# Patient Record
Sex: Male | Born: 1995 | Race: White | Hispanic: No | Marital: Married | Smoking: Never smoker
Health system: Southern US, Community
[De-identification: ages and names within clinical notes are randomized; demographics above are authoritative.]

## PROBLEM LIST (undated history)

## (undated) DIAGNOSIS — J45909 Unspecified asthma, uncomplicated: Secondary | ICD-10-CM

## (undated) DIAGNOSIS — F329 Major depressive disorder, single episode, unspecified: Secondary | ICD-10-CM

## (undated) DIAGNOSIS — F419 Anxiety disorder, unspecified: Secondary | ICD-10-CM

## (undated) DIAGNOSIS — F1910 Other psychoactive substance abuse, uncomplicated: Secondary | ICD-10-CM

## (undated) DIAGNOSIS — F32A Depression, unspecified: Secondary | ICD-10-CM

## (undated) HISTORY — PX: ADENOIDECTOMY: SUR15

---
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
## 2013-12-21 ENCOUNTER — Encounter (HOSPITAL_COMMUNITY): Payer: Self-pay | Admitting: Emergency Medicine

## 2013-12-21 ENCOUNTER — Emergency Department (HOSPITAL_COMMUNITY)
Admission: EM | Admit: 2013-12-21 | Discharge: 2013-12-21 | Disposition: A | Discharge status: Home / Self Care | Payer: Medicaid Other | Attending: Emergency Medicine | Admitting: Emergency Medicine

## 2013-12-21 DIAGNOSIS — T401X1A Poisoning by heroin, accidental (unintentional), initial encounter: Secondary | ICD-10-CM | POA: Insufficient documentation

## 2013-12-21 DIAGNOSIS — J45909 Unspecified asthma, uncomplicated: Secondary | ICD-10-CM | POA: Insufficient documentation

## 2013-12-21 DIAGNOSIS — T401X4A Poisoning by heroin, undetermined, initial encounter: Secondary | ICD-10-CM | POA: Insufficient documentation

## 2013-12-21 DIAGNOSIS — Y9389 Activity, other specified: Secondary | ICD-10-CM | POA: Insufficient documentation

## 2013-12-21 DIAGNOSIS — F17200 Nicotine dependence, unspecified, uncomplicated: Secondary | ICD-10-CM | POA: Insufficient documentation

## 2013-12-21 DIAGNOSIS — Y929 Unspecified place or not applicable: Secondary | ICD-10-CM | POA: Insufficient documentation

## 2013-12-21 HISTORY — DX: Unspecified asthma, uncomplicated: J45.909

## 2013-12-21 NOTE — ED Notes (Signed)
Patient was unconscious and apnic on ems arrival. 2 mg of Narcan IM.

## 2013-12-21 NOTE — ED Notes (Signed)
Bed: RESB Expected date:  Expected time:  Means of arrival:  Comments: Heroin overdose 

## 2013-12-21 NOTE — ED Provider Notes (Signed)
CSN: [id number]     Arrival date & time 12/21/13  1708 History   First MD Initiated Contact with Patient 12/21/13 1716     Chief Complaint  Patient presents with  . Drug Overdose   (Consider location/radiation/quality/duration/timing/severity/associated sxs/prior Treatment) Patient is a 17 y.o. male presenting with Overdose. The history is provided by the patient.  Drug Overdose   patient here after an unintentional overdose of heroin prior to arrival. He injected medication to his left antecubital fossa and denies that this was a suicide attempt. Was found by his grandparents unresponsive and EMS was called. He had pinpoint pupils he was given Narcan 2 mg IM and he quickly returned to normal. He denies being short of breath or having any cough. Denies any alcohol use today. States he was only trying to become intoxicated. He feels back to his baseline at this time  Past Medical History  Diagnosis Date  . Asthma    History reviewed. No pertinent past surgical history. No family history on file. History  Substance Use Topics  . Smoking status: Current Every Day Smoker  . Smokeless tobacco: Not on file  . Alcohol Use: Yes    Review of Systems  All other systems reviewed and are negative.    Allergies  Review of patient's allergies indicates no known allergies.  Home Medications   Current Outpatient Rx  Name  Route  Sig  Dispense  Refill  . HYDROcodone-acetaminophen (NORCO/VICODIN) 5-325 MG per tablet   Oral   Take 1 tablet by mouth every 4 (four) hours as needed for pain.   12 tablet   0    BP 133/77  Pulse 95  Temp(Src) 98.2 F (36.8 C) (Oral)  Resp 16  Wt 175 lb (79.379 kg)  SpO2 95% Physical Exam  Nursing note and vitals reviewed. Constitutional: He is oriented to person, place, and time. He appears well-developed and well-nourished.  Non-toxic appearance. No distress.  HENT:  Head: Normocephalic and atraumatic.  Eyes: Conjunctivae, EOM and lids are normal.  Pupils are equal, round, and reactive to light.  Neck: Normal range of motion. Neck supple. No tracheal deviation present. No mass present.  Cardiovascular: Normal rate, regular rhythm and normal heart sounds.  Exam reveals no gallop.   No murmur heard. Pulmonary/Chest: Effort normal and breath sounds normal. No stridor. No respiratory distress. He has no decreased breath sounds. He has no wheezes. He has no rhonchi. He has no rales.  Abdominal: Soft. Normal appearance and bowel sounds are normal. He exhibits no distension. There is no tenderness. There is no rebound and no CVA tenderness.  Musculoskeletal: Normal range of motion. He exhibits no edema and no tenderness.  Neurological: He is alert and oriented to person, place, and time. He has normal strength. No cranial nerve deficit or sensory deficit. GCS eye subscore is 4. GCS verbal subscore is 5. GCS motor subscore is 6.  Skin: Skin is warm and dry. No abrasion and no rash noted.  Psychiatric: He has a normal mood and affect. His speech is normal and behavior is normal. He expresses no suicidal plans and no homicidal plans.    ED Course  Procedures (including critical care time) Labs Review Labs Reviewed - No data to display Imaging Review No results found.  EKG Interpretation   None       MDM  No diagnosis found. Patient monitored here for one hour and without recurrence of symptoms. He is requesting to leave. He does not want  detox at this time    Toy Baker, MD 12/21/13 [phone]

## 2015-01-01 ENCOUNTER — Encounter (HOSPITAL_COMMUNITY): Payer: Self-pay | Admitting: *Deleted

## 2015-01-01 ENCOUNTER — Emergency Department (HOSPITAL_COMMUNITY)
Admission: EM | Admit: 2015-01-01 | Discharge: 2015-01-01 | Disposition: A | Discharge status: Home / Self Care | Payer: Medicaid Other | Attending: Emergency Medicine | Admitting: Emergency Medicine

## 2015-01-01 DIAGNOSIS — Z720 Tobacco use: Secondary | ICD-10-CM | POA: Insufficient documentation

## 2015-01-01 DIAGNOSIS — J45909 Unspecified asthma, uncomplicated: Secondary | ICD-10-CM | POA: Insufficient documentation

## 2015-01-01 DIAGNOSIS — L03114 Cellulitis of left upper limb: Secondary | ICD-10-CM | POA: Insufficient documentation

## 2015-01-01 MED ORDER — CLINDAMYCIN HCL 150 MG PO CAPS: 300.0000 mg | ORAL_CAPSULE | Freq: Three times a day (TID) | ORAL | Status: AC

## 2015-01-01 MED ORDER — NALOXONE HCL 1 MG/ML IJ SOLN: INTRAMUSCULAR | Status: AC

## 2015-01-01 MED ORDER — CLINDAMYCIN PHOSPHATE 900 MG/50ML IV SOLN
900.0000 mg | Freq: Once | INTRAVENOUS | Status: AC
  Administered 2015-01-01: 900 mg via INTRAVENOUS
  Filled 2015-01-01: qty 50

## 2015-01-01 NOTE — ED Notes (Signed)
Pt reports iv drug use, now has redness and swelling to left wrist and forearm.

## 2015-01-01 NOTE — Discharge Instructions (Signed)
As discussed, it is important that you monitor your infection carefully, and do not hesitate to return here if he develops new, or concerning changes in your condition.  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

## 2015-01-01 NOTE — ED Provider Notes (Signed)
CSN: [id number]     Arrival date & time 01/01/15  1853 History   First MD Initiated Contact with Patient 01/01/15 2047     Chief Complaint  Patient presents with  . Arm Pain  . Cellulitis      HPI  Patient presents with concern of pain in his left lateral wrist. Pain and erythema began to develop significant after he attempted to inject heroin into his veins That attempt was unsuccessful, but the patient has continued to use her with several times since that injection. Concurrently, patient has felt increasing erythema, pain, swelling about the injection site. No concurrent fever, chills, nausea, vomiting, loss of sensation or strength in the digits. No relief with anything.   Past Medical History  Diagnosis Date  . Asthma    History reviewed. No pertinent past surgical history. History reviewed. No pertinent family history. History  Substance Use Topics  . Smoking status: Current Every Day Smoker    Types: Cigarettes  . Smokeless tobacco: Not on file  . Alcohol Use: Yes    Review of Systems  Constitutional:       Per HPI, otherwise negative  HENT:       Per HPI, otherwise negative  Respiratory:       Per HPI, otherwise negative  Cardiovascular:       Per HPI, otherwise negative  Gastrointestinal: Negative for nausea and vomiting.  Endocrine:       Negative aside from HPI  Genitourinary:       Neg aside from HPI   Musculoskeletal:       Per HPI, otherwise negative  Skin: Positive for color change.  Neurological: Negative for syncope.      Allergies  Review of patient's allergies indicates no known allergies.  Home Medications   Prior to Admission medications   Not on File   BP 136/68 mmHg  Pulse 99  Temp(Src) 98.3 F (36.8 C) (Oral)  Resp 18  SpO2 97% Physical Exam  Constitutional: He is oriented to person, place, and time. He appears well-developed. No distress.  HENT:  Head: Normocephalic and atraumatic.  Eyes: Conjunctivae and EOM are  normal.  Cardiovascular: Normal rate and regular rhythm.   Pulmonary/Chest: Effort normal. No stridor. No respiratory distress.  Abdominal: He exhibits no distension.  Musculoskeletal: He exhibits no edema.  Neurological: He is alert and oriented to person, place, and time.  Psychiatric: He has a normal mood and affect.  Nursing note and vitals reviewed.  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 Patient's mother states that the patient is scheduled to go to jail in 4 days, she also has counseled him on the need for her when addiction counseling.  ED Course  Procedures (including critical care time) On repeat exam the patient states that he feels better, erythema appears smaller. I counseled the patient on the need to stop using heroin. The patient and his mother request a prescription for Narcan. Counseled on the need to proceed immediately to the hospital if the patient does require this education, and again I reinforced importance of stopping narcotics altogether.   MDM  Cellulitis about IV drug injection site  Patient w cellulits w no e/o N/V issues, nor bacteremia / sepsis.  IV meds provided w decent response. Patient is not interested in any therapy for his drug abuse.   He was d/c w transition to oral meds.     Gerhard Munch, MD 01/01/15 2255

## 2015-05-23 ENCOUNTER — Encounter (HOSPITAL_COMMUNITY): Payer: Self-pay

## 2015-05-23 ENCOUNTER — Emergency Department (HOSPITAL_COMMUNITY)
Admission: EM | Admit: 2015-05-23 | Discharge: 2015-05-23 | Disposition: A | Discharge status: Home / Self Care | Payer: Medicaid Other | Attending: Emergency Medicine | Admitting: Emergency Medicine

## 2015-05-23 DIAGNOSIS — Y9389 Activity, other specified: Secondary | ICD-10-CM | POA: Insufficient documentation

## 2015-05-23 DIAGNOSIS — T401X1A Poisoning by heroin, accidental (unintentional), initial encounter: Secondary | ICD-10-CM | POA: Insufficient documentation

## 2015-05-23 DIAGNOSIS — X58XXXA Exposure to other specified factors, initial encounter: Secondary | ICD-10-CM | POA: Insufficient documentation

## 2015-05-23 DIAGNOSIS — Y998 Other external cause status: Secondary | ICD-10-CM | POA: Insufficient documentation

## 2015-05-23 DIAGNOSIS — Z720 Tobacco use: Secondary | ICD-10-CM | POA: Insufficient documentation

## 2015-05-23 DIAGNOSIS — J45909 Unspecified asthma, uncomplicated: Secondary | ICD-10-CM | POA: Insufficient documentation

## 2015-05-23 DIAGNOSIS — Y9289 Other specified places as the place of occurrence of the external cause: Secondary | ICD-10-CM | POA: Insufficient documentation

## 2015-05-23 NOTE — Discharge Instructions (Signed)
Narcotic Overdose  A narcotic overdose is the misuse or overuse of a narcotic drug. A narcotic overdose can make you pass out and stop breathing. If you are not treated right away, this can cause permanent brain damage or stop your heart. Medicine may be given to reverse the effects of an overdose. If so, this medicine may bring on withdrawal symptoms. The symptoms may be abdominal cramps, throwing up (vomiting), sweating, chills, and nervousness.  Injecting narcotics can cause more problems than just an overdose. AIDS, hepatitis, and other very serious infections are transmitted by sharing needles and syringes. If you decide to quit using, there are medicines which can help you through the withdrawal period. Trying to quit all at once on your own can be uncomfortable, but not life-threatening. Call your caregiver, Narcotics Anonymous, or any drug and alcohol treatment program for further help.   Document Released: 01/22/2005 Document Revised: 03/08/2012 Document Reviewed: 11/16/2009  [employer]® Patient Information ©2015 [employer]. This information is not intended to replace advice given to you by your health care provider. Make sure you discuss any questions you have with your health care provider.

## 2015-05-23 NOTE — ED Notes (Signed)
Pt escorted to discharge window. Verbalized understanding discharge instructions. In no acute distress.  Pt reports that he will wait in the lobby for his grandmother.

## 2015-05-23 NOTE — ED Notes (Signed)
Patient transported to X-ray 

## 2015-05-23 NOTE — ED Notes (Signed)
Pt reports doing "0.005 bag."  Sts "it's hardly any at all.  I think, it's because I was so drunk."

## 2015-05-23 NOTE — ED Provider Notes (Addendum)
CSN: [id number]     Arrival date & time 05/23/15  0709 History   First MD Initiated Contact with Patient 05/23/15 [id number]     Chief Complaint  Patient presents with  . Drug Overdose      The history is provided by the patient.   Pt with heroin use this morning. Had been drinking last night as well. Found unresponsive. 2mg  narcan given with resolution of symptoms. Feels fine at this time. No other complaints. Alert and oriented.    Past Medical History  Diagnosis Date  . Asthma    History reviewed. No pertinent past surgical history. History reviewed. No pertinent family history. History  Substance Use Topics  . Smoking status: Current Every Day Smoker -- 0.75 packs/day    Types: Cigarettes  . Smokeless tobacco: Not on file  . Alcohol Use: Yes    Review of Systems  All other systems reviewed and are negative.     Allergies  Review of patient's allergies indicates no known allergies.  Home Medications   Prior to Admission medications   Medication Sig Start Date End Date Taking? Authorizing Provider  naloxone [facility](NARCAN) 1 MG/ML injection Use as directed on packaging.  You must go to the hospital after using this medication. 01/01/15   Gerhard Munchobert Lockwood, MD   BP 126/71 mmHg  Pulse 103  Temp(Src) 98.3 F (36.8 C) (Oral)  Resp 18  Ht 5\' 6"  (1.676 m)  Wt 175 lb (79.379 kg)  BMI 28.26 kg/m2  SpO2 96% Physical Exam  Constitutional: He is oriented to person, place, and time. He appears well-developed and well-nourished.  HENT:  Head: Normocephalic and atraumatic.  Eyes: EOM are normal.  Neck: Normal range of motion.  Cardiovascular: Normal rate, regular rhythm, normal heart sounds and intact distal pulses.   Pulmonary/Chest: Effort normal and breath sounds normal. No respiratory distress.  Abdominal: Soft. He exhibits no distension. There is no tenderness.  Musculoskeletal: Normal range of motion.  Neurological: He is alert and oriented to person, place, and time.  Skin:  Skin is warm and dry.  Psychiatric: He has a normal mood and affect. Judgment normal.  Nursing note and vitals reviewed.   ED Course  Procedures (including critical care time) Labs Review Labs Reviewed - No data to display  Imaging Review No results found.   EKG Interpretation   Date/Time:  Wednesday May 23 2015 07:14:50 EDT Ventricular Rate:  100 PR Interval:  158 QRS Duration: 83 QT Interval:  331 QTC Calculation: 427 R Axis:   82 Text Interpretation:  Sinus tachycardia ST elev, probable normal early  repol pattern No old tracing to compare Confirmed by Bao Bazen  MD, Agness Sibrian  ([id number]) on 05/23/2015 8:35:22 AM      MDM   Final diagnoses:  None    Will observe pt in ER for 90 minutes. If continues to do well will dc home.   8:33 AM Continues to do well.  Dc home  Azalia BilisKevin Monserat Prestigiacomo, MD 05/23/15 [id number]  Azalia BilisKevin Sherina Stammer, MD 05/23/15 [phone]

## 2015-05-23 NOTE — ED Notes (Addendum)
Per EMS, patient was found unconscious on the pavement in an apartment [place]. Respirations were initially 4 times a min. Pt was given NARCAN 2mg  nasally. Pt is alert and able to move from stretcher to stretcher. This was accident not intentionally.

## 2015-08-08 ENCOUNTER — Inpatient Hospital Stay (HOSPITAL_COMMUNITY)
Admission: EM | Admit: 2015-08-08 | Discharge: 2015-08-12 | DRG: 918 | Disposition: A | Discharge status: Home / Self Care | Payer: Self-pay | Attending: Family Medicine | Admitting: Family Medicine

## 2015-08-08 DIAGNOSIS — E872 Acidosis, unspecified: Secondary | ICD-10-CM | POA: Diagnosis present

## 2015-08-08 DIAGNOSIS — T407X4A Poisoning by cannabis (derivatives), undetermined, initial encounter: Secondary | ICD-10-CM | POA: Diagnosis present

## 2015-08-08 DIAGNOSIS — F17210 Nicotine dependence, cigarettes, uncomplicated: Secondary | ICD-10-CM | POA: Diagnosis present

## 2015-08-08 DIAGNOSIS — T50901A Poisoning by unspecified drugs, medicaments and biological substances, accidental (unintentional), initial encounter: Secondary | ICD-10-CM | POA: Diagnosis present

## 2015-08-08 DIAGNOSIS — A419 Sepsis, unspecified organism: Secondary | ICD-10-CM | POA: Diagnosis present

## 2015-08-08 DIAGNOSIS — J4520 Mild intermittent asthma, uncomplicated: Secondary | ICD-10-CM

## 2015-08-08 DIAGNOSIS — F22 Delusional disorders: Secondary | ICD-10-CM

## 2015-08-08 DIAGNOSIS — N179 Acute kidney failure, unspecified: Secondary | ICD-10-CM

## 2015-08-08 DIAGNOSIS — Y92009 Unspecified place in unspecified non-institutional (private) residence as the place of occurrence of the external cause: Secondary | ICD-10-CM

## 2015-08-08 DIAGNOSIS — T40604A Poisoning by unspecified narcotics, undetermined, initial encounter: Secondary | ICD-10-CM | POA: Diagnosis present

## 2015-08-08 DIAGNOSIS — R000 Tachycardia, unspecified: Secondary | ICD-10-CM

## 2015-08-08 DIAGNOSIS — T50904A Poisoning by unspecified drugs, medicaments and biological substances, undetermined, initial encounter: Secondary | ICD-10-CM

## 2015-08-08 DIAGNOSIS — T405X1A Poisoning by cocaine, accidental (unintentional), initial encounter: Principal | ICD-10-CM | POA: Diagnosis present

## 2015-08-08 DIAGNOSIS — J45909 Unspecified asthma, uncomplicated: Secondary | ICD-10-CM | POA: Diagnosis present

## 2015-08-08 DIAGNOSIS — F1410 Cocaine abuse, uncomplicated: Secondary | ICD-10-CM | POA: Diagnosis present

## 2015-08-08 HISTORY — DX: Other psychoactive substance abuse, uncomplicated: F19.10

## 2015-08-08 HISTORY — DX: Depression, unspecified: F32.A

## 2015-08-08 HISTORY — DX: Anxiety disorder, unspecified: F41.9

## 2015-08-08 HISTORY — DX: Major depressive disorder, single episode, unspecified: F32.9

## 2015-08-08 MED ORDER — LORAZEPAM 2 MG/ML IJ SOLN
INTRAMUSCULAR | Status: AC
  Filled 2015-08-08: qty 1

## 2015-08-08 MED ORDER — SODIUM CHLORIDE 0.9 % IV SOLN
2.0000 g | Freq: Once | INTRAVENOUS | Status: AC
  Administered 2015-08-09: 2 g via INTRAVENOUS
  Filled 2015-08-08: qty 20

## 2015-08-08 MED ORDER — SODIUM CHLORIDE 0.9 % IV BOLUS (SEPSIS)
2000.0000 mL | Freq: Once | INTRAVENOUS | Status: AC
  Administered 2015-08-09: 2000 mL via INTRAVENOUS

## 2015-08-08 MED ORDER — LORAZEPAM 2 MG/ML IJ SOLN
2.0000 mg | Freq: Once | INTRAMUSCULAR | Status: AC
  Administered 2015-08-09: 2 mg via INTRAVENOUS

## 2015-08-08 NOTE — ED Provider Notes (Addendum)
CSN: [id number]     Arrival date & time 08/08/15  2344 History  This chart was scribed for Tomasita Crumble, MD by Freida Busman, ED Scribe. This patient was seen in room [facility] and the patient's care was started 11:45 PM.    No chief complaint on file.  LEVEL 5 CAVEAT DUE TO Acuity of Condition   The history is provided by the EMS personnel. No language interpreter was used.    HPI Comments:  Dennis Sherman is a 19 y.o. male who presents to the Emergency Department brought in by ambulance for drug overdose. Per EMS pt was found unresponsive by a Emergency planning/management officer. Pt was tachycardic and diaphoretic on scene; he was given 2mg  of Narcan en route which improved his alertness. Pt admitted to heroin use and possible cocaine use; reported to EMS that he had multiple substances onboard.    Past Medical History  Diagnosis Date  . Asthma    No past surgical history on file. No family history on file. Social History  Substance Use Topics  . Smoking status: Current Every Day Smoker -- 0.75 packs/day    Types: Cigarettes  . Smokeless tobacco: Not on file  . Alcohol Use: Yes    Review of Systems  Unable to perform ROS: Acuity of condition      Allergies  Review of patient's allergies indicates no known allergies.  Home Medications   Prior to Admission medications   Medication Sig Start Date End Date Taking? Authorizing Provider  naloxone [facility]) 1 MG/ML injection Use as directed on packaging.  You must go to the hospital after using this medication. 01/01/15   Gerhard Munch, MD   There were no vitals taken for this visit. Physical Exam  Constitutional: Vital signs are normal. He appears well-developed and well-nourished.  Non-toxic appearance. He does not appear ill. He appears distressed.  HENT:  Head: Normocephalic and atraumatic.  Nose: Nose normal.  Mouth/Throat: Oropharynx is clear and moist. No oropharyngeal exudate.  Eyes: Conjunctivae and EOM are normal. Pupils are equal,  round, and reactive to light. No scleral icterus.  Neck: Normal range of motion. Neck supple. No tracheal deviation, no edema, no erythema and normal range of motion present. No thyroid mass and no thyromegaly present.  Cardiovascular: Regular rhythm, S1 normal, S2 normal, normal heart sounds, intact distal pulses and normal pulses.  Tachycardia present.  Exam reveals no gallop and no friction rub.   No murmur heard. Pulses:      Radial pulses are 2+ on the right side, and 2+ on the left side.       Dorsalis pedis pulses are 2+ on the right side, and 2+ on the left side.  Pulmonary/Chest: Effort normal and breath sounds normal. No respiratory distress. He has no wheezes. He has no rhonchi. He has no rales.  Abdominal: Soft. Normal appearance and bowel sounds are normal. He exhibits no distension, no ascites and no mass. There is no hepatosplenomegaly. There is no tenderness. There is no rebound, no guarding and no CVA tenderness.  Musculoskeletal: Normal range of motion. He exhibits no edema or tenderness.  Lymphadenopathy:    He has no cervical adenopathy.  Neurological: He is alert. He has normal strength. No cranial nerve deficit or sensory deficit.  Does not follow commands  Clinically intoxicated   Skin: Skin is warm and intact. No petechiae noted. He is diaphoretic. No pallor.  Multiple superficial abrasions to BLE, bilateral hands and chest   Psychiatric: He has  a normal mood and affect. His behavior is normal. Judgment normal.  Nursing note and vitals reviewed.   ED Course  Procedures   DIAGNOSTIC STUDIES:  Oxygen Saturation is 89% on RA}, low by my interpretation.    COORDINATION OF CARE:  11:58 PM Discussed treatment plan with pt at bedside and pt agreed to plan.  Labs Review Labs Reviewed  CBC WITH DIFFERENTIAL/PLATELET - Abnormal; Notable for the following:    WBC 16.1 (*)    Neutro Abs 8.7 (*)    Lymphs Abs 6.0 (*)    Basophils Absolute 0.2 (*)    All other  components within normal limits  COMPREHENSIVE METABOLIC PANEL - Abnormal; Notable for the following:    CO2 13 (*)    Glucose, Bld 116 (*)    Creatinine, Ser 2.37 (*)    Calcium 10.4 (*)    Total Protein 8.7 (*)    AST 53 (*)    GFR calc non Af Amer 37 (*)    GFR calc Af Amer 43 (*)    Anion gap 23 (*)    All other components within normal limits  URINALYSIS, ROUTINE W REFLEX MICROSCOPIC (NOT AT [facility]) - Abnormal; Notable for the following:    Color, Urine AMBER (*)    APPearance CLOUDY (*)    Bilirubin Urine SMALL (*)    Ketones, ur 15 (*)    Protein, ur 100 (*)    All other components within normal limits  URINE RAPID DRUG SCREEN, HOSP PERFORMED - Abnormal; Notable for the following:    Opiates POSITIVE (*)    Cocaine POSITIVE (*)    Tetrahydrocannabinol POSITIVE (*)    All other components within normal limits  ACETAMINOPHEN LEVEL - Abnormal; Notable for the following:    Acetaminophen (Tylenol), Serum <10 (*)    All other components within normal limits  URINE MICROSCOPIC-ADD ON - Abnormal; Notable for the following:    Bacteria, UA FEW (*)    Crystals CA OXALATE CRYSTALS (*)    All other components within normal limits  I-STAT CHEM 8, ED - Abnormal; Notable for the following:    Creatinine, Ser 2.20 (*)    Glucose, Bld 112 (*)    Hemoglobin 17.3 (*)    All other components within normal limits  LIPASE, BLOOD  ETHANOL  SALICYLATE LEVEL  CK  OSMOLALITY  I-STAT CG4 LACTIC ACID, ED    Imaging Review Dg Chest Port 1 View  08/09/2015   CLINICAL DATA:  Acute onset of tachycardia. Overdose. Initial encounter.  EXAM: PORTABLE CHEST - 1 VIEW  COMPARISON:  Chest radiograph performed 09/07/2006  FINDINGS: The lungs are well-aerated and clear. There is no evidence of focal opacification, pleural effusion or pneumothorax.  The cardiomediastinal silhouette is within normal limits. No acute osseous abnormalities are seen.  IMPRESSION: No acute cardiopulmonary process seen.    Electronically Signed   By: Roanna Raider M.D.   On: 08/09/2015 00:55     EKG Interpretation   Date/Time:  Wednesday August 08 2015 23:48:47 EDT Ventricular Rate:  182 PR Interval:  81 QRS Duration: 90 QT Interval:  315 QTC Calculation: 548 R Axis:   86 Text Interpretation:  Sinus tachycardia Confirmed by Erroll Luna  [phone]) on 08/09/2015 1:59:20 AM      MDM   Final diagnoses:  None   Patient presents emergency department for altered mental status. EMS states he was using heroin earlier tonight. Physical exam reveals a likely using cocaine as well,  as the patient has dilated pupils, is tachycardic and diaphoretic. He was given 2 mg of IV Ativan as well as 2 L of fluid. Heart rate responded coming down from 190-114. He is also febrile to 39.4 with a white count of 16. We'll cover broadly with broad-spectrum antibodies. He has a large anion gap of 23. Patient may have ethylene glycol or alcohol toxicity.  I spoke with Dr. Clyde Lundborg with [facility] who will admit the patient to step down unit for further management.  CRITICAL CARE Performed by: Tomasita Crumble   Total critical care time:65min - HR 190, given IV ativan  Critical care time was exclusive of separately billable procedures and treating other patients.  Critical care was necessary to treat or prevent imminent or life-threatening deterioration.  Critical care was time spent personally by me on the following activities: development of treatment plan with patient and/or surrogate as well as nursing, discussions with consultants, evaluation of patient's response to treatment, examination of patient, obtaining history from patient or surrogate, ordering and performing treatments and interventions, ordering and review of laboratory studies, ordering and review of radiographic studies, pulse oximetry and re-evaluation of patient's condition.    I personally performed the services described in this documentation, which  was scribed in my presence. The recorded information has been reviewed and is accurate.      Tomasita Crumble, MD 08/09/15 [phone]

## 2015-08-09 ENCOUNTER — Encounter (HOSPITAL_COMMUNITY): Payer: Self-pay

## 2015-08-09 ENCOUNTER — Emergency Department (HOSPITAL_COMMUNITY): Payer: Self-pay

## 2015-08-09 ENCOUNTER — Inpatient Hospital Stay (HOSPITAL_COMMUNITY): Payer: Medicaid Other

## 2015-08-09 DIAGNOSIS — A419 Sepsis, unspecified organism: Secondary | ICD-10-CM

## 2015-08-09 DIAGNOSIS — N179 Acute kidney failure, unspecified: Secondary | ICD-10-CM

## 2015-08-09 DIAGNOSIS — E872 Acidosis, unspecified: Secondary | ICD-10-CM | POA: Diagnosis present

## 2015-08-09 DIAGNOSIS — J45909 Unspecified asthma, uncomplicated: Secondary | ICD-10-CM | POA: Diagnosis present

## 2015-08-09 DIAGNOSIS — T50901A Poisoning by unspecified drugs, medicaments and biological substances, accidental (unintentional), initial encounter: Secondary | ICD-10-CM | POA: Diagnosis present

## 2015-08-09 DIAGNOSIS — T50904A Poisoning by unspecified drugs, medicaments and biological substances, undetermined, initial encounter: Secondary | ICD-10-CM

## 2015-08-09 LAB — LIPASE, BLOOD: Lipase: 27 U/L (ref 22–51)

## 2015-08-09 LAB — URINALYSIS, ROUTINE W REFLEX MICROSCOPIC
Glucose, UA: NEGATIVE mg/dL
Hgb urine dipstick: NEGATIVE
Ketones, ur: 15 mg/dL — AB
LEUKOCYTES UA: NEGATIVE
Nitrite: NEGATIVE
PROTEIN: 100 mg/dL — AB
SPECIFIC GRAVITY, URINE: 1.03 (ref 1.005–1.030)
UROBILINOGEN UA: 1 mg/dL (ref 0.0–1.0)
pH: 5.5 (ref 5.0–8.0)

## 2015-08-09 LAB — CBC WITH DIFFERENTIAL/PLATELET
BASOS PCT: 1 % (ref 0–1)
Basophils Absolute: 0.2 10*3/uL — ABNORMAL HIGH (ref 0.0–0.1)
EOS ABS: 0.2 10*3/uL (ref 0.0–0.7)
Eosinophils Relative: 1 % (ref 0–5)
HCT: 46.2 % (ref 39.0–52.0)
Hemoglobin: 16.1 g/dL (ref 13.0–17.0)
LYMPHS PCT: 37 % (ref 12–46)
Lymphs Abs: 6 10*3/uL — ABNORMAL HIGH (ref 0.7–4.0)
MCH: 28.4 pg (ref 26.0–34.0)
MCHC: 34.8 g/dL (ref 30.0–36.0)
MCV: 81.6 fL (ref 78.0–100.0)
MONOS PCT: 6 % (ref 3–12)
Monocytes Absolute: 1 10*3/uL (ref 0.1–1.0)
NEUTROS ABS: 8.7 10*3/uL — AB (ref 1.7–7.7)
NEUTROS PCT: 55 % (ref 43–77)
Platelets: 233 10*3/uL (ref 150–400)
RBC: 5.66 MIL/uL (ref 4.22–5.81)
RDW: 12.9 % (ref 11.5–15.5)
WBC: 16.1 10*3/uL — ABNORMAL HIGH (ref 4.0–10.5)

## 2015-08-09 LAB — COMPREHENSIVE METABOLIC PANEL
ALBUMIN: 3.4 g/dL — AB (ref 3.5–5.0)
ALT: 22 U/L (ref 17–63)
ALT: 20 U/L (ref 17–63)
AST: 41 U/L (ref 15–41)
AST: 53 U/L — AB (ref 15–41)
Albumin: 4.7 g/dL (ref 3.5–5.0)
Alkaline Phosphatase: 71 U/L (ref 38–126)
Alkaline Phosphatase: 46 U/L (ref 38–126)
Anion gap: 23 — ABNORMAL HIGH (ref 5–15)
Anion gap: 10 (ref 5–15)
BILIRUBIN TOTAL: 0.7 mg/dL (ref 0.3–1.2)
BUN: 13 mg/dL (ref 6–20)
BUN: 19 mg/dL (ref 6–20)
CALCIUM: 10.4 mg/dL — AB (ref 8.9–10.3)
CHLORIDE: 108 mmol/L (ref 101–111)
CHLORIDE: 113 mmol/L — AB (ref 101–111)
CO2: 13 mmol/L — AB (ref 22–32)
CO2: 18 mmol/L — ABNORMAL LOW (ref 22–32)
CREATININE: 2.37 mg/dL — AB (ref 0.61–1.24)
Calcium: 8.8 mg/dL — ABNORMAL LOW (ref 8.9–10.3)
Creatinine, Ser: 2.2 mg/dL — ABNORMAL HIGH (ref 0.61–1.24)
GFR calc Af Amer: 48 mL/min — ABNORMAL LOW (ref >60)
GFR calc non Af Amer: 41 mL/min — ABNORMAL LOW (ref >60)
GFR, EST AFRICAN AMERICAN: 43 mL/min — AB (ref >60)
GFR, EST NON AFRICAN AMERICAN: 37 mL/min — AB (ref >60)
GLUCOSE: 116 mg/dL — AB (ref 65–99)
Glucose, Bld: 111 mg/dL — ABNORMAL HIGH (ref 65–99)
POTASSIUM: 3.4 mmol/L — AB (ref 3.5–5.1)
Potassium: 4.6 mmol/L (ref 3.5–5.1)
SODIUM: 141 mmol/L (ref 135–145)
Sodium: 144 mmol/L (ref 135–145)
TOTAL PROTEIN: 6.1 g/dL — AB (ref 6.5–8.1)
Total Bilirubin: 1.5 mg/dL — ABNORMAL HIGH (ref 0.3–1.2)
Total Protein: 8.7 g/dL — ABNORMAL HIGH (ref 6.5–8.1)

## 2015-08-09 LAB — I-STAT ARTERIAL BLOOD GAS, ED
Acid-base deficit: 7 mmol/L — ABNORMAL HIGH (ref 0.0–2.0)
Bicarbonate: 19.3 mEq/L — ABNORMAL LOW (ref 20.0–24.0)
O2 Saturation: 94 %
PH ART: 7.265 — AB (ref 7.350–7.450)
Patient temperature: 102.9
TCO2: 20 mmol/L (ref 0–100)
pCO2 arterial: 43.7 mmHg (ref 35.0–45.0)
pO2, Arterial: 93 mmHg (ref 80.0–100.0)

## 2015-08-09 LAB — HIV ANTIBODY (ROUTINE TESTING W REFLEX): HIV Screen 4th Generation wRfx: NONREACTIVE

## 2015-08-09 LAB — URINE MICROSCOPIC-ADD ON

## 2015-08-09 LAB — CBC
HCT: 38.1 % — ABNORMAL LOW (ref 39.0–52.0)
Hemoglobin: 13 g/dL (ref 13.0–17.0)
MCH: 28.4 pg (ref 26.0–34.0)
MCHC: 34.1 g/dL (ref 30.0–36.0)
MCV: 83.4 fL (ref 78.0–100.0)
Platelets: 132 10*3/uL — ABNORMAL LOW (ref 150–400)
RBC: 4.57 MIL/uL (ref 4.22–5.81)
RDW: 13.1 % (ref 11.5–15.5)
WBC: 16 10*3/uL — ABNORMAL HIGH (ref 4.0–10.5)

## 2015-08-09 LAB — I-STAT CHEM 8, ED
BUN: 18 mg/dL (ref 6–20)
CALCIUM ION: 1.17 mmol/L (ref 1.12–1.23)
CREATININE: 2.2 mg/dL — AB (ref 0.61–1.24)
Chloride: 109 mmol/L (ref 101–111)
GLUCOSE: 112 mg/dL — AB (ref 65–99)
HCT: 51 % (ref 39.0–52.0)
HEMOGLOBIN: 17.3 g/dL — AB (ref 13.0–17.0)
Potassium: 4.5 mmol/L (ref 3.5–5.1)
Sodium: 143 mmol/L (ref 135–145)
TCO2: 14 mmol/L (ref 0–100)

## 2015-08-09 LAB — VOLATILES,BLD-ACETONE,ETHANOL,ISOPROP,METHANOL
Acetone, blood: NEGATIVE % (ref 0.000–0.010)
Ethanol, blood: NEGATIVE % (ref 0.000–0.010)
Isopropanol, blood: NEGATIVE % (ref 0.000–0.010)
Methanol, blood: NEGATIVE % (ref 0.000–0.010)

## 2015-08-09 LAB — I-STAT CG4 LACTIC ACID, ED: Lactic Acid, Venous: 13.43 mmol/L (ref 0.5–2.0)

## 2015-08-09 LAB — RAPID URINE DRUG SCREEN, HOSP PERFORMED
Amphetamines: NOT DETECTED
Barbiturates: NOT DETECTED
Benzodiazepines: NOT DETECTED
Cocaine: POSITIVE — AB
Opiates: POSITIVE — AB
Tetrahydrocannabinol: POSITIVE — AB

## 2015-08-09 LAB — MRSA PCR SCREENING: MRSA by PCR: NEGATIVE

## 2015-08-09 LAB — LACTIC ACID, PLASMA
Lactic Acid, Venous: 0.6 mmol/L (ref 0.5–2.0)
Lactic Acid, Venous: 0.5 mmol/L (ref 0.5–2.0)

## 2015-08-09 LAB — SALICYLATE LEVEL

## 2015-08-09 LAB — PROCALCITONIN: Procalcitonin: 1.93 ng/mL

## 2015-08-09 LAB — MAGNESIUM: Magnesium: 2.5 mg/dL — ABNORMAL HIGH (ref 1.7–2.4)

## 2015-08-09 LAB — ETHYLENE GLYCOL: ETHYLENE GLYCOL LVL: NOT DETECTED mg/dL

## 2015-08-09 LAB — PHOSPHORUS: PHOSPHORUS: 1.4 mg/dL — AB (ref 2.5–4.6)

## 2015-08-09 LAB — OSMOLALITY: Osmolality: 295 mOsm/kg (ref 275–300)

## 2015-08-09 LAB — APTT: aPTT: 27 seconds (ref 24–37)

## 2015-08-09 LAB — CREATININE, URINE, RANDOM: Creatinine, Urine: 515.73 mg/dL

## 2015-08-09 LAB — SODIUM, URINE, RANDOM: SODIUM UR: 15 mmol/L

## 2015-08-09 LAB — ETHANOL: Alcohol, Ethyl (B): <5 mg/dL (ref <5)

## 2015-08-09 LAB — ACETAMINOPHEN LEVEL: Acetaminophen (Tylenol), Serum: <10 ug/mL — ABNORMAL LOW (ref 10–30)

## 2015-08-09 LAB — CK: CK TOTAL: 429 U/L — AB (ref 49–397)

## 2015-08-09 LAB — PROTIME-INR
INR: 1.19 (ref 0.00–1.49)
PROTHROMBIN TIME: 15.3 s — AB (ref 11.6–15.2)

## 2015-08-09 MED ORDER — PIPERACILLIN-TAZOBACTAM 3.375 G IVPB 30 MIN
3.3750 g | Freq: Once | INTRAVENOUS | Status: AC
Start: 2015-08-09 — End: 2015-08-09
  Administered 2015-08-09: 3.375 g via INTRAVENOUS
  Filled 2015-08-09: qty 50

## 2015-08-09 MED ORDER — ADULT MULTIVITAMIN W/MINERALS CH
1.0000 | ORAL_TABLET | Freq: Every day | ORAL | Status: DC
  Administered 2015-08-09 – 2015-08-12 (×3): 1 via ORAL
  Filled 2015-08-09 (×4): qty 1

## 2015-08-09 MED ORDER — LEVALBUTEROL HCL 1.25 MG/0.5ML IN NEBU
1.2500 mg | INHALATION_SOLUTION | Freq: Four times a day (QID) | RESPIRATORY_TRACT | Status: DC | PRN
  Filled 2015-08-09: qty 0.5

## 2015-08-09 MED ORDER — LORAZEPAM 2 MG/ML IJ SOLN
1.0000 mg | Freq: Four times a day (QID) | INTRAMUSCULAR | Status: AC | PRN
Start: 2015-08-09 — End: 2015-08-12
  Administered 2015-08-10 – 2015-08-11 (×3): 1 mg via INTRAVENOUS
  Filled 2015-08-09 (×3): qty 1

## 2015-08-09 MED ORDER — ACETAMINOPHEN 650 MG RE SUPP: 650.0000 mg | Freq: Four times a day (QID) | RECTAL | Status: DC | PRN

## 2015-08-09 MED ORDER — ACETAMINOPHEN 325 MG PO TABS: 650.0000 mg | ORAL_TABLET | Freq: Four times a day (QID) | ORAL | Status: DC | PRN

## 2015-08-09 MED ORDER — SODIUM CHLORIDE 0.9 % IJ SOLN
3.0000 mL | Freq: Two times a day (BID) | INTRAMUSCULAR | Status: DC
  Administered 2015-08-09 – 2015-08-10 (×2): 3 mL via INTRAVENOUS

## 2015-08-09 MED ORDER — LORAZEPAM 2 MG/ML IJ SOLN
0.0000 mg | Freq: Four times a day (QID) | INTRAMUSCULAR | Status: AC
Start: 2015-08-09 — End: 2015-08-11
  Administered 2015-08-09 (×2): 2 mg via INTRAVENOUS
  Administered 2015-08-10 (×3): 4 mg via INTRAVENOUS
  Administered 2015-08-10: 2 mg via INTRAVENOUS
  Filled 2015-08-09: qty 2
  Filled 2015-08-09 (×5): qty 1

## 2015-08-09 MED ORDER — SODIUM CHLORIDE 0.9 % IV SOLN
INTRAVENOUS | Status: DC
  Administered 2015-08-09 – 2015-08-10 (×3): via INTRAVENOUS

## 2015-08-09 MED ORDER — SODIUM CHLORIDE 0.9 % IV BOLUS (SEPSIS)
1000.0000 mL | Freq: Once | INTRAVENOUS | Status: AC
  Administered 2015-08-09: 1000 mL via INTRAVENOUS

## 2015-08-09 MED ORDER — VITAMIN B-1 100 MG PO TABS
100.0000 mg | ORAL_TABLET | Freq: Every day | ORAL | Status: DC
  Administered 2015-08-09 – 2015-08-12 (×3): 100 mg via ORAL
  Filled 2015-08-09 (×4): qty 1

## 2015-08-09 MED ORDER — LORAZEPAM 2 MG/ML IJ SOLN
0.0000 mg | Freq: Two times a day (BID) | INTRAMUSCULAR | Status: DC
  Administered 2015-08-11: 2 mg via INTRAVENOUS
  Administered 2015-08-12: 4 mg via INTRAVENOUS
  Filled 2015-08-09: qty 1
  Filled 2015-08-09 (×2): qty 2

## 2015-08-09 MED ORDER — FOLIC ACID 1 MG PO TABS
1.0000 mg | ORAL_TABLET | Freq: Every day | ORAL | Status: DC
  Administered 2015-08-09 – 2015-08-12 (×3): 1 mg via ORAL
  Filled 2015-08-09 (×4): qty 1

## 2015-08-09 MED ORDER — VANCOMYCIN HCL IN DEXTROSE 1-5 GM/200ML-% IV SOLN
1000.0000 mg | INTRAVENOUS | Status: DC
  Administered 2015-08-10: 1000 mg via INTRAVENOUS
  Filled 2015-08-09: qty 200

## 2015-08-09 MED ORDER — PIPERACILLIN-TAZOBACTAM 3.375 G IVPB
3.3750 g | Freq: Three times a day (TID) | INTRAVENOUS | Status: DC
  Administered 2015-08-09 – 2015-08-10 (×3): 3.375 g via INTRAVENOUS
  Filled 2015-08-09 (×5): qty 50

## 2015-08-09 MED ORDER — HEPARIN SODIUM (PORCINE) 5000 UNIT/ML IJ SOLN
5000.0000 [IU] | Freq: Three times a day (TID) | INTRAMUSCULAR | Status: DC
  Administered 2015-08-09 – 2015-08-12 (×6): 5000 [IU] via SUBCUTANEOUS
  Filled 2015-08-09 (×12): qty 1

## 2015-08-09 MED ORDER — VANCOMYCIN HCL 10 G IV SOLR
1500.0000 mg | Freq: Once | INTRAVENOUS | Status: AC
  Administered 2015-08-09: 1500 mg via INTRAVENOUS
  Filled 2015-08-09: qty 1500

## 2015-08-09 MED ORDER — THIAMINE HCL 100 MG/ML IJ SOLN
100.0000 mg | Freq: Every day | INTRAMUSCULAR | Status: DC
  Filled 2015-08-09: qty 1

## 2015-08-09 MED ORDER — NICOTINE 21 MG/24HR TD PT24
21.0000 mg | MEDICATED_PATCH | Freq: Every day | TRANSDERMAL | Status: DC
  Administered 2015-08-09 – 2015-08-12 (×4): 21 mg via TRANSDERMAL
  Filled 2015-08-09 (×5): qty 1

## 2015-08-09 MED ORDER — BOOST / RESOURCE BREEZE PO LIQD
1.0000 | Freq: Three times a day (TID) | ORAL | Status: DC
  Administered 2015-08-09: 0.9875 via ORAL
  Administered 2015-08-10 – 2015-08-12 (×3): 1 via ORAL

## 2015-08-09 MED ORDER — LORAZEPAM 1 MG PO TABS
1.0000 mg | ORAL_TABLET | Freq: Four times a day (QID) | ORAL | Status: AC | PRN
  Administered 2015-08-10: 1 mg via ORAL
  Filled 2015-08-09 (×3): qty 1

## 2015-08-09 MED ORDER — PNEUMOCOCCAL VAC POLYVALENT 25 MCG/0.5ML IJ INJ
0.5000 mL | INJECTION | INTRAMUSCULAR | Status: DC
  Filled 2015-08-09: qty 0.5

## 2015-08-09 NOTE — Progress Notes (Signed)
ANTIBIOTIC CONSULT NOTE - INITIAL  Pharmacy Consult for Vancomycin and Zosyn Indication: Sespsis  No Known Allergies  Patient Measurements: Height:  (170.2 cm) Weight: 165 lb (74.844 kg) IBW/kg (Calculated) : 66.1  Vital Signs: Temp: 98.9 F (37.2 C) (08/11 0248) Temp Source: Oral (08/11 0248) BP: 119/64 mmHg (08/11 0928) Pulse Rate: 83 (08/11 0928) Intake/Output from previous day: 08/10 0701 - 08/11 0700 In: 2000 [I.V.:2000] Out: 10 [Urine:10] Intake/Output from this shift:    Labs:  Recent Labs  08/08/15 2352 08/08/15 2356 08/09/15 0011 08/09/15 0523  WBC  --  16.1*  --  16.0*  HGB  --  16.1 17.3* 13.0  PLT  --  233  --  132*  LABCREA 515.73  --   --   --   CREATININE  --  2.37* 2.20* 2.20*   Estimated Creatinine Clearance: 49.7 mL/min (by C-G formula based on Cr of 2.2). No results for input(s): VANCOTROUGH, VANCOPEAK, VANCORANDOM, GENTTROUGH, GENTPEAK, GENTRANDOM, TOBRATROUGH, TOBRAPEAK, TOBRARND, AMIKACINPEAK, AMIKACINTROU, AMIKACIN in the last 72 hours.   Microbiology: Recent Results (from the past 720 hour(s))  Culture, blood (routine x 2)     Status: None (Preliminary result)   Collection Time: 08/09/15  2:13 AM  Result Value Ref Range Status   Specimen Description BLOOD LEFT HAND  Final   Special Requests BOTTLES DRAWN AEROBIC ONLY 6CC  Final   Culture PENDING  Incomplete   Report Status PENDING  Incomplete    Medical History: Past Medical History  Diagnosis Date  . Asthma     Assessment: 19 yo M presents on 8/10 with overdose and AMS. PMH includes substance abuse, smoking, and alcohol. Positive UDS for cocaine, THC, and opiate. Pharmacy consulted to dose abx for sepsis. Tmax up to 102.9, WBC elevated at 16. SCr increased up to 2.2, CrCl ~11ml/min. Received 1 time dose of Zosyn and vancomycin early this morning.  Goal of Therapy:  Vancomycin trough level 15-20 mcg/ml  Resolution of infection  Plan:  Start Zosyn 3.375 gm IV q8h (4 hour  infusion) Start vancomycin 1g IV Q24 tomorrow Monitor clinical picture, renal function, VT at Css F/U C&S, abx deescalation / LOT    Corley Maffeo J 08/09/2015,10:16 AM

## 2015-08-09 NOTE — ED Notes (Signed)
After attempting to get temp oral pt became combatitve, we had to hold pt in order to get rectal temp and in and out cath

## 2015-08-09 NOTE — ED Notes (Signed)
Patient transported to Ultrasound 

## 2015-08-09 NOTE — H&P (Signed)
[facility] History and Physical  Dennis Sherman ZOX:[id number] DOB: Nov 10, 1996 DOA: 08/08/2015  Referring physician: ED physician PCP: No primary care provider on file.  Specialists:   Chief Complaint: overdose and AMS  HPI: Dennis Sherman is a 19 y.o. male with PMH of asthma, substance abuse, including smoking, alcohol drinking and cocaine, who presents with drug overdose and AMS.  Patient has AMS and is not talking, is unable to provide much history. Therefore, most of the history is obtained by discussing the case with the ED physician and per EMS report. Per EMS pt was found unresponsive by a Emergency planning/management officer. Pt was tachycardic and diaphoretic on scene; he was given 2 mg of Narcan en route which improved his alertness. Pt admitted to heroin use and possible cocaine use; reported to EMS that he had multiple substances onboard. He dose not seem to have any pain. He moves all his extremities.  In ED, patient was found to have positive UDS for cocaine, THC and opiate, negative urinalysis, lipase 27, CBC 16.1, temperature 102.9, tachycardia, AKi, high AG 23, bicarbonate 13, alcohol level less than 5, salicylate level less than 4, acetaminophen level less than 10, Osm 295. Chest x-ray is negative for acute abnormalities. Patient is admitted to inpatient for further reevaluation and treatment.  Where does patient live?   At home  Can patient participate in ADLs?  Yes       Review of Systems: Could not be obtained due to altered mental status.   Allergy: No Known Allergies  Past Medical History  Diagnosis Date  . Asthma     History reviewed. No pertinent past surgical history.  Social History:  reports that he has been smoking Cigarettes.  He has been smoking about 0.75 packs per day. He does not have any smokeless tobacco history on file. He reports that he drinks alcohol. He reports that he uses illicit drugs (Cocaine and IV).  Family History: History reviewed. No pertinent family  history.   Could not be obtained due to altered mental status.  Prior to Admission medications   Medication Sig Start Date End Date Taking? Authorizing Provider  naloxone [facility]) 1 MG/ML injection Use as directed on packaging.  You must go to the [facility] after using this medication. 01/01/15   Gerhard Munch, MD    Physical Exam: Filed Vitals:   08/09/15 0530 08/09/15 0545 08/09/15 0600 08/09/15 0615  BP: 113/63 111/63 114/63 108/65  Pulse: 92 90 92 92  Temp:      TempSrc:      Resp: 19 12 13 13   Height:      Weight:      SpO2: 96% 95% 96% 94%   General: Not in acute distress HEENT:       Eyes: PERRL, EOMI, no scleral icterus.       ENT: No discharge from the ears and nose, no pharynx injection, no tonsillar enlargement.        Neck: No JVD, no bruit, no mass felt. Heme: No neck lymph node enlargement. Cardiac: S1/S2, RRR, No murmurs, No gallops or rubs. Pulm: No rales, wheezing, rhonchi or rubs. Abd: Soft, nondistended, no organomegaly, BS present. Ext: No pitting leg edema bilaterally. 2+DP/PT pulse bilaterally. Musculoskeletal: No joint deformities, No joint redness or warmth, no limitation of ROM in spin. Skin: No rashes. Has many scratching marks.  Neuro: not oriented X3, cranial nerves II-XII grossly intact, moves all extremities. Psych: Patient is not psychotic, no suicidal or hemocidal ideation.  Labs on Admission:  Basic Metabolic Panel:  Recent Labs Lab 08/08/15 2356 08/09/15 0011 08/09/15 0523  NA 144 143 141  K 4.6 4.5 3.4*  CL 108 109 113*  CO2 13*  --  18*  GLUCOSE 116* 112* 111*  BUN CREATININE 2.37* 2.20* 2.20*  CALCIUM 10.4*  --  8.8*  MG 2.5*  --   --   PHOS 1.4*  --   --    Liver Function Tests:  Recent Labs Lab 08/08/15 2356 08/09/15 0523  AST 53* 41  ALT 22 20  ALKPHOS 71 46  BILITOT 0.7 1.5*  PROT 8.7* 6.1*  ALBUMIN 4.7 3.4*    Recent Labs Lab 08/08/15 2356  LIPASE 27   No results for input(s): AMMONIA in the  last 168 hours. CBC:  Recent Labs Lab 08/08/15 2356 08/09/15 0011 08/09/15 0523  WBC 16.1*  --  16.0*  NEUTROABS 8.7*  --   --   HGB 16.1 17.3* 13.0  HCT 46.2 51.0 38.1*  MCV 81.6  --  83.4  PLT 233  --  132*   Cardiac Enzymes:  Recent Labs Lab 08/09/15 0213  CKTOTAL 429*    BNP (last 3 results) No results for input(s): BNP in the last 8760 hours.  ProBNP (last 3 results) No results for input(s): PROBNP in the last 8760 hours.  CBG: No results for input(s): GLUCAP in the last 168 hours.  Radiological Exams on Admission: US Renal  08/09/2015   CLINICAL DATA:  Acute onset of renal insufficiency. Initial encounter.  EXAM: RENAL / URINARY TRACT ULTRASOUND COMPLETE  COMPARISON:  None.  FINDINGS: Right Kidney:  Length: 10.9 cm. Echogenicity within normal limits. No mass or hydronephrosis visualized.  Left Kidney:  Length: 9.5 cm. Echogenicity within normal limits. No mass or hydronephrosis visualized.  Bladder:  Not visualized on this study.  IMPRESSION: Unremarkable renal ultrasound. Bladder not characterized on this study.   Electronically Signed   By: Roanna Raider M.D.   On: 08/09/2015 04:03   Dg Chest Port 1 View  08/09/2015   CLINICAL DATA:  Acute onset of tachycardia. Overdose. Initial encounter.  EXAM: PORTABLE CHEST - 1 VIEW  COMPARISON:  Chest radiograph performed 09/07/2006  FINDINGS: The lungs are well-aerated and clear. There is no evidence of focal opacification, pleural effusion or pneumothorax.  The cardiomediastinal silhouette is within normal limits. No acute osseous abnormalities are seen.  IMPRESSION: No acute cardiopulmonary process seen.   Electronically Signed   By: Roanna Raider M.D.   On: 08/09/2015 00:55    EKG: Independently reviewed.  Abnormal findings: QTC 548, tachycardia   Assessment/Plan Principal Problem:   Drug overdose Active Problems:   Asthma   AKI (acute kidney injury)   Sepsis   Overdose   Metabolic acidosis  Drug overdose: UDS  positive for cocaine, THC and opiates. Patient admitted to have overdosed cracks and heroine. He has high anion Gap of 23, AKi and metabolic acidosis, and Ethylene glycol poison is a potential differential diagnosis, but his Osm gap is only 4, making this possibility less likely. -will admit to SUD -Frequent neuro check -Aggressive IV fluid: 3L of normal saline bolus, then 75 mL per hour -check ABG -check Volatiles and Ethylene glycol level  Asthma: stable -prn Xopenex nebulizers  AKI: Differential diagnoses include pre-renal failure, and rhabdomyolysis. -check CK -IV fluids as above - Check FeNa - US-renal - Follow up renal function by BMP  Sepsis: Patient is septic on  admission, with leukocytosis, fever and tachycardia. Etiology is not clear. Chest x-ray is negative, urinalysis and negative. -will get Procalcitonin and trend lactic acid levels per sepsis protocol. -IVF: as above -f/u Bx and Ux -IV vancomycin and Zosyn empirically.   DVT ppx: SQ Heparin  Code Status: Full code Family Communication: None at bed side.   Disposition Plan: Admit to inpatient   Date of Service 08/09/2015    Lorretta Harp [facility] Pager [phone]  If 7PM-7AM, please contact night-coverage [email or web address] Password [email or web address] 08/09/2015, 6:50 AM

## 2015-08-09 NOTE — Progress Notes (Addendum)
UR COMPLETED  

## 2015-08-09 NOTE — Care Management Note (Signed)
Case Management Note  Patient Details  Name: Dennis Sherman MRN: [id number] Date of Birth: 18-Aug-1996  Subjective/Objective:                 Admitted with drug overdose/sepsis   Action/Plan: Discharge Planning  Expected Discharge Date:                  Expected Discharge Plan:  Home/Self Care  In-House Referral:  Clinical Social Work  Discharge planning Services  CM Consult  Post Acute Care Choice:    Choice offered to:     DME Arranged:    DME Agency:     HH Arranged:    HH Agency:     Status of Service:  In process, will continue to follow  Medicare Important Message Given:    Date Medicare IM Given:    Medicare IM give by:    Date Additional Medicare IM Given:    Additional Medicare Important Message give by:     If discussed at Long Length of Stay Meetings, dates discussed:    Additional Comments: Darrick Grinder [facility]) [phone], Darrick Grinder [phone]  Gae Gallop [place], [place] [phone] 08/09/2015, 3:36 PM

## 2015-08-09 NOTE — ED Notes (Signed)
Meal tray ordered for pt  

## 2015-08-09 NOTE — ED Notes (Signed)
MD at bedside. 

## 2015-08-09 NOTE — ED Notes (Signed)
After rectal temp and cath performed, pt looked at me and asked if it would be best if he cooperated. This rn stated yes and informed that we are only attempting to help pt, pt voices understanding.

## 2015-08-09 NOTE — ED Notes (Addendum)
Pt found staggering in road by pd, they called EMS and found tachy, diaphoretic, hr 16-190, given intranasal narcan 2 mg, with positive effects, pupils dilated on arrival. Per ems initially pinpoint. Pt used heroin tonight

## 2015-08-09 NOTE — Progress Notes (Signed)
PT Cancellation Note  Patient Details Name: ORLANDUS BOROWSKI MRN: [id number] DOB: Jul 30, 1996   Cancelled Treatment:    Reason Eval/Treat Not Completed: Patient not medically ready. PT eval received, chart reviewed. Pt currently with strict bed rest order. Will continue to follow and evaluate pt when activity orders are progressed.    Conni Slipper 08/09/2015, 7:20 AM   Conni Slipper, PT, DPT Acute Rehabilitation Services Pager: [phone]

## 2015-08-09 NOTE — Progress Notes (Signed)
Subjective: Patient admitted  this morning , see detailed H&P by Dr. Lorretta Harp 19 year old male with a history of asthma, substance abuse alcohol and cocaine abuse came with drug overdose and altered mental status. Patient was found to be in high anion gap metabolic acidosis. Also had leukocytosis fever and tachycardia started on empiric antibiotics vancomycin and Zosyn. Filed Vitals:   08/09/15 1517  BP: 117/62  Pulse: 69  Temp: 98.2 F (36.8 C)  Resp: 15    Chest: Clear Bilaterally Heart : S1S2 RRR Abdomen: Soft, nontender Ext : No edema Neuro: Alert, oriented x 3  A/P Sepsis Altered mental status Acute kidney injury Asthma  Continue IV antibiotics. Follow blood cultures and urine culture results Continue IV fluids AG  now closed    Meredeth Ide [facility] Pager- [phone]

## 2015-08-10 DIAGNOSIS — E872 Acidosis: Secondary | ICD-10-CM

## 2015-08-10 DIAGNOSIS — J4520 Mild intermittent asthma, uncomplicated: Secondary | ICD-10-CM

## 2015-08-10 LAB — COMPREHENSIVE METABOLIC PANEL
ALK PHOS: 43 U/L (ref 38–126)
ALT: 71 U/L — ABNORMAL HIGH (ref 17–63)
ANION GAP: 7 (ref 5–15)
AST: 97 U/L — ABNORMAL HIGH (ref 15–41)
Albumin: 3 g/dL — ABNORMAL LOW (ref 3.5–5.0)
BILIRUBIN TOTAL: 0.8 mg/dL (ref 0.3–1.2)
BUN: 16 mg/dL (ref 6–20)
CHLORIDE: 113 mmol/L — AB (ref 101–111)
CO2: 21 mmol/L — ABNORMAL LOW (ref 22–32)
Calcium: 8.6 mg/dL — ABNORMAL LOW (ref 8.9–10.3)
Creatinine, Ser: 1.55 mg/dL — ABNORMAL HIGH (ref 0.61–1.24)
GFR calc Af Amer: >60 mL/min (ref >60)
Glucose, Bld: 85 mg/dL (ref 65–99)
Potassium: 3.9 mmol/L (ref 3.5–5.1)
Sodium: 141 mmol/L (ref 135–145)
Total Protein: 5.8 g/dL — ABNORMAL LOW (ref 6.5–8.1)

## 2015-08-10 LAB — GLUCOSE, CAPILLARY: Glucose-Capillary: 113 mg/dL — ABNORMAL HIGH (ref 65–99)

## 2015-08-10 LAB — URINE CULTURE: Culture: NO GROWTH

## 2015-08-10 LAB — CBC
HCT: 36.8 % — ABNORMAL LOW (ref 39.0–52.0)
Hemoglobin: 12.9 g/dL — ABNORMAL LOW (ref 13.0–17.0)
MCH: 28.7 pg (ref 26.0–34.0)
MCHC: 35.1 g/dL (ref 30.0–36.0)
MCV: 82 fL (ref 78.0–100.0)
PLATELETS: 112 10*3/uL — AB (ref 150–400)
RBC: 4.49 MIL/uL (ref 4.22–5.81)
RDW: 13.1 % (ref 11.5–15.5)
WBC: 6.4 10*3/uL (ref 4.0–10.5)

## 2015-08-10 MED ORDER — VANCOMYCIN HCL IN DEXTROSE 1-5 GM/200ML-% IV SOLN
1000.0000 mg | Freq: Two times a day (BID) | INTRAVENOUS | Status: DC
  Filled 2015-08-10: qty 200

## 2015-08-10 MED ORDER — LORAZEPAM 2 MG/ML IJ SOLN
1.0000 mg | Freq: Once | INTRAMUSCULAR | Status: AC
  Administered 2015-08-11: 1 mg via INTRAVENOUS
  Filled 2015-08-10: qty 1

## 2015-08-10 NOTE — Progress Notes (Signed)
[facility] PROGRESS NOTE  Dennis Sherman ZOX:[id number] DOB: 07-03-1996 DOA: 08/08/2015 PCP: No primary care provider on file.  Assessment/Plan: 1. Altered mental status/drug overdose- resolved, heroin /cocaine abuse. Patient denies suicidal ideation. He is agreeable to get rehab as outpatient. Will get social work consult. 2. Fever/? Sepsis- when patient arrived to ED yesterday, he was febrile with leukocytosis. He was started on vancomycin and zosyn  empirically. He is now afebrile, wbc is back to normal. All the cultures have been negative so fr. CXR clear, UA clear. Will discontinue IV antibiotics. 3. Metabolic acidosis- resolved, AG closed.  4. AKI- patient presented with AKI, with Cr of 2.20, was started on IV fluids and the creatinine is now down to 1.55. Continue with IV fluids. Check bmp in am. 5. Asthma - stable, continue Xopenex prn 6. Alcohol abuse- no withdrawal, continue CIWA protocol 7. DVT prophylaxis- heparin  Code Status: Full code Family Communication: No family at bedside Disposition Plan: Likely home in 1-2 days   Consultants:  None  Procedures:  None  Antibiotics:  Vancomycin  Zosyn  HPI/Subjective: year-old male with a history of asthma, substance abuse alcohol and cocaine abuse came with drug overdose and altered mental status. Patient was found to be in high anion gap metabolic acidosis. Also had leukocytosis fever and tachycardia started on empiric antibiotics vancomycin and Zosyn This morning patient denies any complaints, has been afebrile.   Objective: Filed Vitals:   08/10/15 1100  BP:   Pulse: 70  Temp:   Resp:     Intake/Output Summary (Last 24 hours) at 08/10/15 1150 Last data filed at 08/10/15 1100  Gross per 24 hour  Intake 3528.75 ml  Output   2525 ml  Net 1003.75 ml   Filed Weights   08/09/15 0016 08/09/15 1508  Weight: 74.844 kg (165 lb) 74.5 kg (164 lb 3.9 oz)    Exam:   General:  Appears in no acute  distress  Cardiovascular: S1S2 RRR  Respiratory: Clear bilaterally  Abdomen: Soft, nontender, no organomegaly  Musculoskeletal: No edema of the lower extremities   Data Reviewed: Basic Metabolic Panel:  Recent Labs Lab 08/08/15 2356 08/09/15 0011 08/09/15 0523 08/10/15 0916  NA 144 143 141 141  K 4.6 4.5 3.4* 3.9  CL 108 109 113* 113*  CO2 13*  --  18* 21*  GLUCOSE 116* 112* 111* 85  BUN CREATININE 2.37* 2.20* 2.20* 1.55*  CALCIUM 10.4*  --  8.8* 8.6*  MG 2.5*  --   --   --   PHOS 1.4*  --   --   --    Liver Function Tests:  Recent Labs Lab 08/08/15 2356 08/09/15 0523 08/10/15 0916  AST 53* 41 97*  ALT 22 20 71*  ALKPHOS 71 46 43  BILITOT 0.7 1.5* 0.8  PROT 8.7* 6.1* 5.8*  ALBUMIN 4.7 3.4* 3.0*    Recent Labs Lab 08/08/15 2356  LIPASE 27   No results for input(s): AMMONIA in the last 168 hours. CBC:  Recent Labs Lab 08/08/15 2356 08/09/15 0011 08/09/15 0523 08/10/15 0916  WBC 16.1*  --  16.0* 6.4  NEUTROABS 8.7*  --   --   --   HGB 16.1 17.3* 13.0 12.9*  HCT 46.2 51.0 38.1* 36.8*  MCV 81.6  --  83.4 82.0  PLT 233  --  132* 112*   Cardiac Enzymes:  Recent Labs Lab 08/09/15 0213  CKTOTAL 429*   BNP (last 3 Nuno Brubacherts)  No results for input(s): BNP in the last 8760 hours.  ProBNP (last 3 results) No results for input(s): PROBNP in the last 8760 hours.  CBG:  Recent Labs Lab 08/10/15 0811  GLUCAP 113*    Recent Results (from the past 240 hour(s))  Urine culture     Status: None   Collection Time: 08/08/15 11:52 PM  Result Value Ref Range Status   Specimen Description URINE, RANDOM  Final   Special Requests NONE  Final   Culture NO GROWTH 1 DAY  Final   Report Status 08/10/2015 FINAL  Final  Culture, blood (routine x 2)     Status: None (Preliminary result)   Collection Time: 08/09/15  2:13 AM  Result Value Ref Range Status   Specimen Description BLOOD LEFT HAND  Final   Special Requests BOTTLES DRAWN AEROBIC ONLY  6CC  Final   Culture PENDING  Incomplete   Report Status PENDING  Incomplete  MRSA PCR Screening     Status: None   Collection Time: 08/09/15  3:30 PM  Result Value Ref Range Status   MRSA by PCR NEGATIVE NEGATIVE Final    Comment:        The GeneXpert MRSA Assay (FDA approved for NASAL specimens only), is one component of a comprehensive MRSA colonization surveillance program. It is not intended to diagnose MRSA infection nor to guide or monitor treatment for MRSA infections.      Studies: US Renal  08/09/2015   CLINICAL DATA:  Acute onset of renal insufficiency. Initial encounter.  EXAM: RENAL / URINARY TRACT ULTRASOUND COMPLETE  COMPARISON:  None.  FINDINGS: Right Kidney:  Length: 10.9 cm. Echogenicity within normal limits. No mass or hydronephrosis visualized.  Left Kidney:  Length: 9.5 cm. Echogenicity within normal limits. No mass or hydronephrosis visualized.  Bladder:  Not visualized on this study.  IMPRESSION: Unremarkable renal ultrasound. Bladder not characterized on this study.   Electronically Signed   By: Roanna Raider M.D.   On: 08/09/2015 04:03   Dg Chest Port 1 View  08/09/2015   CLINICAL DATA:  Acute onset of tachycardia. Overdose. Initial encounter.  EXAM: PORTABLE CHEST - 1 VIEW  COMPARISON:  Chest radiograph performed 09/07/2006  FINDINGS: The lungs are well-aerated and clear. There is no evidence of focal opacification, pleural effusion or pneumothorax.  The cardiomediastinal silhouette is within normal limits. No acute osseous abnormalities are seen.  IMPRESSION: No acute cardiopulmonary process seen.   Electronically Signed   By: Roanna Raider M.D.   On: 08/09/2015 00:55    Scheduled Meds: . feeding supplement  1 Container Oral TID BM  . folic acid  1 mg Oral Daily  . heparin  5,000 Units Subcutaneous 3 times per day  . LORazepam  0-4 mg Intravenous Q6H   Followed by  . [START ON 08/11/2015] LORazepam  0-4 mg Intravenous Q12H  . multivitamin with minerals   1 tablet Oral Daily  . nicotine  21 mg Transdermal Daily  . piperacillin-tazobactam (ZOSYN)  IV  3.375 g Intravenous 3 times per day  . pneumococcal 23 valent vaccine  0.5 mL Intramuscular Tomorrow-1000  . sodium chloride  3 mL Intravenous Q12H  . thiamine  100 mg Oral Daily   Or  . thiamine  100 mg Intravenous Daily  . vancomycin  1,000 mg Intravenous Q12H   Continuous Infusions: . sodium chloride 75 mL/hr at 08/10/15 1308    Principal Problem:   Drug overdose Active Problems:   Asthma  AKI (acute kidney injury)   Sepsis   Overdose   Metabolic acidosis    Time spent: 25 min    [facility] S  [facility] Pager [phone]. If 7PM-7AM, please contact night-coverage at [email or web address], password [facility] 08/10/2015, 11:50 AM  LOS: 1 day

## 2015-08-10 NOTE — Progress Notes (Signed)
ANTIBIOTIC CONSULT NOTE - Follow-Up  Pharmacy Consult for Vancomycin and Zosyn Indication: Sespsis  No Known Allergies  Patient Measurements: Height: 5\' 7"  (170.2 cm) Weight: 164 lb 3.9 oz (74.5 kg) IBW/kg (Calculated) : 66.1  Vital Signs: Temp: 98 F (36.7 C) (08/12 0700) Temp Source: Oral (08/12 0700) BP: 107/53 mmHg (08/12 0530) Pulse Rate: 70 (08/12 1100) Intake/Output from previous day: 08/11 0701 - 08/12 0700 In: 3212.5 [P.O.:1310; I.V.:1702.5; IV Piggyback:200] Out: 2625 [Urine:2625] Intake/Output from this shift: Total I/O In: 316.3 [I.V.:316.3] Out: 500 [Urine:500]  Labs:  Recent Labs  08/08/15 2352  08/08/15 2356 08/09/15 0011 08/09/15 0523 08/10/15 0916  WBC  --   --  16.1*  --  16.0* 6.4  HGB  --   < > 16.1 17.3* 13.0 12.9*  PLT  --   --  233  --  132* 112*  LABCREA 515.73  --   --   --   --   --   CREATININE  --   < > 2.37* 2.20* 2.20* 1.55*  < > = values in this interval not displayed. Estimated Creatinine Clearance: 70.5 mL/min (by C-G formula based on Cr of 1.55). No results for input(s): VANCOTROUGH, VANCOPEAK, VANCORANDOM, GENTTROUGH, GENTPEAK, GENTRANDOM, TOBRATROUGH, TOBRAPEAK, TOBRARND, AMIKACINPEAK, AMIKACINTROU, AMIKACIN in the last 72 hours.   Microbiology: Recent Results (from the past 720 hour(s))  Urine culture     Status: None   Collection Time: 08/08/15 11:52 PM  Result Value Ref Range Status   Specimen Description URINE, RANDOM  Final   Special Requests NONE  Final   Culture NO GROWTH 1 DAY  Final   Report Status 08/10/2015 FINAL  Final  Culture, blood (routine x 2)     Status: None (Preliminary result)   Collection Time: 08/09/15  2:13 AM  Result Value Ref Range Status   Specimen Description BLOOD LEFT HAND  Final   Special Requests BOTTLES DRAWN AEROBIC ONLY 6CC  Final   Culture PENDING  Incomplete   Report Status PENDING  Incomplete  MRSA PCR Screening     Status: None   Collection Time: 08/09/15  3:30 PM  Result Value  Ref Range Status   MRSA by PCR NEGATIVE NEGATIVE Final    Comment:        The GeneXpert MRSA Assay (FDA approved for NASAL specimens only), is one component of a comprehensive MRSA colonization surveillance program. It is not intended to diagnose MRSA infection nor to guide or monitor treatment for MRSA infections.     Medical History: Past Medical History  Diagnosis Date  . Asthma   . Polysubstance abuse     "hospitalized a few times for overdosing" (08/09/2015)  . Anxiety   . Depression     Assessment: 19 yo M presents on 8/10 with overdose and AMS. PMH includes substance abuse, smoking, and alcohol. Positive UDS for cocaine, THC, and opiate. Pharmacy consulted to dose abx for sepsis. Tmax up to 102.9, WBC elevated at 16. SCr increased up to 2.2, CrCl ~35ml/min. Received 1 time dose of Zosyn and vancomycin in ED.  Pt clinically improved.  Afebrile, WBC now wnl, SCr improved to 1.5.  Urine cx neg.  Blood cx NGTD.  Goal of Therapy:  Vancomycin trough level 15-20 mcg/ml  Resolution of infection  Plan:  Continue Zosyn 3.375 gm IV q8h (4 hour infusion) Change vancomycin 1g IV Q12 - next dose due 6pm Monitor clinical picture, renal function, VT at Css F/U C&S, abx deescalation / LOT  [employer], Pharm.D., BCPS Clinical Pharmacist Pager [phone] 08/10/2015 11:39 AM

## 2015-08-10 NOTE — Evaluation (Signed)
Physical Therapy Evaluation Patient Details Name: Dennis Sherman MRN: [id number] DOB: 04/06/1996 Today's Date: 08/10/2015   History of Present Illness  This 19 y.o. male admitted with drug overdose.  Pt admits to using cocain and heroine.  UDS was positive for cocaine, THC, and opiates.   PMH asthma and polysubstance abuse, anxiety and depression   Clinical Impression  Pt admitted with above diagnosis. Pt currently with functional limitations due to the deficits listed below (see PT Problem List). Anticipate patient's balance should return to baseline as drugs (including Ativan) leave his system. Anticipate he will be independent with mobility and would be safe from a mobility standpoint to enter a residential drug rehab program. Will follow.   Follow Up Recommendations No PT follow up    Equipment Recommendations  None recommended by PT    Recommendations for Other Services       Precautions / Restrictions Precautions Precautions: Fall      Mobility  Bed Mobility Overal bed mobility: Modified Independent                Transfers Overall transfer level: Needs assistance Equipment used: None Transfers: Sit to/from Stand Sit to Stand: Min guard Stand pivot transfers: Min guard;Min assist       General transfer comment: assist for balance   Ambulation/Gait Ambulation/Gait assistance: Min guard Ambulation Distance (Feet): 400 Feet Assistive device: None Gait Pattern/deviations: Step-through pattern;Drifts right/left   Gait velocity interpretation: at or above normal speed for age/gender General Gait Details: occasional drift to Left or Rt with head turns; no overt LOB (able to correct himself)  Careers information officer    Modified Rankin (Stroke Patients Only)       Balance Overall balance assessment: Needs assistance Sitting-balance support: Feet supported Sitting balance-Leahy Scale: Good     Standing balance support: No upper  extremity supported Standing balance-Leahy Scale: Poor (especially initially) Standing balance comment: requires occasional min A  Single Leg Stance - Right Leg: 8       Rhomberg - Eyes Opened: 30 Rhomberg - Eyes Closed: 20 (above average ant-post sway; no overt LOB) High level balance activites: Head turns;Direction changes   Standardized Balance Assessment Standardized Balance Assessment : Dynamic Gait Index   Dynamic Gait Index Level Surface: Mild Impairment Change in Gait Speed: Normal Gait with Horizontal Head Turns: Mild Impairment Gait with Vertical Head Turns: Mild Impairment Gait and Pivot Turn: Mild Impairment       Pertinent Vitals/Pain Pain Assessment: 0-10 Pain Score: 2  Pain Location: foot  Pain Descriptors / Indicators: Discomfort Pain Intervention(s): Limited activity within patient's tolerance;Monitored during session;Repositioned    Home Living Family/patient expects to be discharged to:: Unsure                 Additional Comments: Pt reports he hopes to go to inpatient drug rehab.  He reports he bounced between friends' homes PTA    Prior Function Level of Independence: Independent         Comments: Pt reports he graduated from [employer].  He states he was employed, but no longer is      Hand Dominance   Dominant Hand: Right    Extremity/Trunk Assessment   Upper Extremity Assessment: Defer to OT evaluation RUE Deficits / Details: mild tremors      LUE Deficits / Details: mild tremors    Lower Extremity Assessment: Overall WFL for tasks assessed  Cervical / Trunk Assessment: Normal  Communication   Communication: No difficulties  Cognition Arousal/Alertness: Awake/alert Behavior During Therapy: Flat affect Overall Cognitive Status: Within Functional Limits for tasks assessed                      General Comments General comments (skin integrity, edema, etc.): Pt reports he feels his balance is  improved when he is under the influence, and worse when he is straight, but indicates those periods of being without substances are short     Exercises        Assessment/Plan    PT Assessment Patient needs continued PT services  PT Diagnosis Difficulty walking   PT Problem List Decreased balance;Decreased mobility  PT Treatment Interventions DME instruction;Gait training;Stair training;Functional mobility training;Therapeutic activities;Balance training;Cognitive remediation;Patient/family education   PT Goals (Current goals can be found in the Care Plan section) Acute Rehab PT Goals Patient Stated Goal: go to inpatient drug rehab PT Goal Formulation: With patient Time For Goal Achievement: 08/15/15 Potential to Achieve Goals: Good    Frequency Min 3X/week   Barriers to discharge  (unknown d/c destination; ?drug rehab)      Co-evaluation               End of Session Equipment Utilized During Treatment: Gait belt Activity Tolerance: Patient tolerated treatment well Patient left: in bed;with bed alarm set;with call bell/phone within reach Nurse Communication: Mobility status         Time: 9147-8295 PT Time Calculation (min) (ACUTE ONLY): 12 min   Charges:   PT Evaluation $Initial PT Evaluation Tier I: 1 Procedure     PT G Codes:        Tekeisha Hakim 08/15/15, 3:42 PM  Pager [phone]

## 2015-08-10 NOTE — Progress Notes (Signed)
PT Cancellation Note  Patient Details Name: Dennis Sherman MRN: [id number] DOB: 28-Aug-1996   Cancelled Treatment:    Reason Eval/Treat Not Completed: Fatigue/lethargy limiting ability to participate   Noted incr activity orders as of 10:00. Per RN, pt had 4 mg of Ativan at 09:30. Attempted to awaken pt for evaluation. He barely opens his eyes to verbal and physical stimulation and goes right back to sleep. Do not feel a balance assessment would be appropriate at this time. RN reports he has walked to bathroom this morning without difficulty. Anticipate he will not have therapy needs, however will attempt to see later today.   Eliel Dudding 08/10/2015, 10:12 AM Pager [phone]

## 2015-08-10 NOTE — Evaluation (Signed)
Occupational Therapy Evaluation Patient Details Name: Dennis Sherman MRN: [id number] DOB: Nov 15, 1996 Today's Date: 08/10/2015    History of Present Illness This 19 y.o. male admitted with drug overdose.  Pt admits to using cocain and heroine.  UDS was positive for cocaine, THC, and opiates.   PMH asthma and polysubstance abuse, anxiety and depression    Clinical Impression   Pt admitted with above. He demonstrates the below listed deficits and will benefit from continued OT to maximize safety and independence with BADLs.  Pt presents with impaired balance and requires min guard - min A with ADLs.  Will follow acutely.   Pt states he is hopeful he will discharge to an inpatient drug treatment facility       Follow Up Recommendations  Other (comment) (Inpatient drug/ETOH rehab )    Equipment Recommendations  None recommended by OT    Recommendations for Other Services       Precautions / Restrictions Precautions Precautions: Fall      Mobility Bed Mobility Overal bed mobility: Modified Independent                Transfers Overall transfer level: Needs assistance   Transfers: Sit to/from Stand;Stand Pivot Transfers Sit to Stand: Min guard Stand pivot transfers: Min guard;Min assist       General transfer comment: assist for balance     Balance Overall balance assessment: Needs assistance Sitting-balance support: Feet supported Sitting balance-Leahy Scale: Good     Standing balance support: During functional activity Standing balance-Leahy Scale: Poor Standing balance comment: requires occasional min A                             ADL Overall ADL's : Needs assistance/impaired Eating/Feeding: Independent;Sitting   Grooming: Wash/dry hands;Wash/dry face;Oral care;Min guard;Standing   Upper Body Bathing: Set up;Sitting   Lower Body Bathing: Minimal assistance;Sit to/from stand   Upper Body Dressing : Set up;Sitting   Lower Body Dressing:  Min guard;Sit to/from stand   Toilet Transfer: Minimal assistance;Ambulation;Comfort height toilet   Toileting- Clothing Manipulation and Hygiene: Min guard;Sit to/from stand       Functional mobility during ADLs: Min guard;Minimal assistance General ADL Comments: Pt requires assist for balance - staggers at times.  Able to retrieve items from floor with min guard assist, but staggered into door and bed requiring min A to recover      Vision Additional Comments: Pt reports he is supposed to wear glasses, but doesn't wear them.  He initially asked therapist the time.  When directed to the clock, he required increased time and effort to read it.    Perception     Praxis Praxis Praxis tested?: Within functional limits    Pertinent Vitals/Pain       Hand Dominance Right   Extremity/Trunk Assessment Upper Extremity Assessment Upper Extremity Assessment: RUE deficits/detail;LUE deficits/detail RUE Deficits / Details: mild tremors  LUE Deficits / Details: mild tremors    Lower Extremity Assessment Lower Extremity Assessment: Defer to PT evaluation   Cervical / Trunk Assessment Cervical / Trunk Assessment: Normal   Communication Communication Communication: No difficulties   Cognition Arousal/Alertness: Awake/alert Behavior During Therapy: Flat affect Overall Cognitive Status: Within Functional Limits for tasks assessed [facility] for basic tasks. Pt is reserved, difficult to assess )                     General Comments  Exercises       Shoulder Instructions      Home Living Family/patient expects to be discharged to:: Unsure                                 Additional Comments: Pt reports he hopes to go to inpatient drug rehab.  He reports he bounced between friends' homes PTA      Prior Functioning/Environment Level of Independence: Independent        Comments: Pt reports he graduated from [employer].  He states he was  employed, but no longer is     OT Diagnosis: Generalized weakness   OT Problem List: Decreased strength;Impaired balance (sitting and/or standing)   OT Treatment/Interventions: Self-care/ADL training;DME and/or AE instruction;Therapeutic activities;Patient/family education;Balance training    OT Goals(Current goals can be found in the care plan section) Acute Rehab OT Goals Patient Stated Goal: to go to rehab  OT Goal Formulation: With patient Time For Goal Achievement: 08/24/15 Potential to Achieve Goals: Good ADL Goals Pt Will Perform Grooming: Independently;standing Pt Will Perform Upper Body Bathing: Independently;standing Pt Will Perform Lower Body Bathing: Independently;sit to/from stand Pt Will Perform Upper Body Dressing: Independently;standing Pt Will Perform Lower Body Dressing: Independently;sit to/from stand Pt Will Transfer to Toilet: Independently;regular height toilet;grab bars;ambulating Pt Will Perform Toileting - Clothing Manipulation and hygiene: Independently;sit to/from stand  OT Frequency: Min 2X/week   Barriers to D/C: Decreased caregiver support          Co-evaluation              End of Session Nurse Communication: Mobility status  Activity Tolerance: Patient tolerated treatment well Patient left: Other (comment) (with PT )   Time: 1610-9604 OT Time Calculation (min): 16 min Charges:  OT General Charges $OT Visit: 1 Procedure OT Evaluation $Initial OT Evaluation Tier I: 1 Procedure G-Codes:    Koula Venier, Ursula Alert M 01-Sep-2015, 1:32 PM

## 2015-08-11 DIAGNOSIS — T50901S Poisoning by unspecified drugs, medicaments and biological substances, accidental (unintentional), sequela: Secondary | ICD-10-CM

## 2015-08-11 LAB — CBC
HCT: 37.9 % — ABNORMAL LOW (ref 39.0–52.0)
Hemoglobin: 12.8 g/dL — ABNORMAL LOW (ref 13.0–17.0)
MCH: 27.7 pg (ref 26.0–34.0)
MCHC: 33.8 g/dL (ref 30.0–36.0)
MCV: 82 fL (ref 78.0–100.0)
Platelets: 125 10*3/uL — ABNORMAL LOW (ref 150–400)
RBC: 4.62 MIL/uL (ref 4.22–5.81)
RDW: 12.8 % (ref 11.5–15.5)
WBC: 7.6 10*3/uL (ref 4.0–10.5)

## 2015-08-11 LAB — COMPREHENSIVE METABOLIC PANEL
ALBUMIN: 3.2 g/dL — AB (ref 3.5–5.0)
ALT: 73 U/L — ABNORMAL HIGH (ref 17–63)
AST: 64 U/L — ABNORMAL HIGH (ref 15–41)
Alkaline Phosphatase: 41 U/L (ref 38–126)
Anion gap: 6 (ref 5–15)
BILIRUBIN TOTAL: 0.9 mg/dL (ref 0.3–1.2)
BUN: 10 mg/dL (ref 6–20)
CALCIUM: 8.7 mg/dL — AB (ref 8.9–10.3)
CO2: 23 mmol/L (ref 22–32)
CREATININE: 1.33 mg/dL — AB (ref 0.61–1.24)
Chloride: 110 mmol/L (ref 101–111)
Glucose, Bld: 117 mg/dL — ABNORMAL HIGH (ref 65–99)
POTASSIUM: 3.7 mmol/L (ref 3.5–5.1)
Sodium: 139 mmol/L (ref 135–145)
Total Protein: 6.1 g/dL — ABNORMAL LOW (ref 6.5–8.1)

## 2015-08-11 LAB — GLUCOSE, CAPILLARY: GLUCOSE-CAPILLARY: 111 mg/dL — AB (ref 65–99)

## 2015-08-11 NOTE — Progress Notes (Signed)
[facility] PROGRESS NOTE  Dennis Sherman [facility] WUJ:[id number] DOB: 19-Oct-1996 DOA: 08/08/2015 PCP: No primary care provider on file.  Assessment/Plan: 1. Altered mental status/drug overdose- resolved, heroin /cocaine abuse. Patient denies suicidal ideation. He is agreeable to get rehab, discussed with social work to see if patient can go to inpatient rehabilitation. 2. Fever/? Sepsis- when patient arrived to ED yesterday, he was febrile with leukocytosis. He was started on vancomycin and zosyn  empirically. He is now afebrile, wbc is back to normal. All the cultures have been negative so fr. CXR clear, UA clear. Antibiotic discontinued yesterday. Patient remains afebrile 3. Metabolic acidosis- resolved, AG closed.  4. AKI- patient presented with AKI, with Cr of 2.20, was started on IV fluids and the creatinine is now down to 1.33. Continue with IV fluids. Check bmp in am. 5. Asthma - stable, continue Xopenex prn 6. Alcohol abuse- getting more anxious, ? withdrawal, continue CIWA protocol 7. DVT prophylaxis- heparin  Code Status: Full code Family Communication: No family at bedside Disposition Plan: Home when medically stable   Consultants:  None  Procedures:  None  Antibiotics:  Vancomycin  Zosyn  HPI/Subjective: year-old male with a history of asthma, substance abuse alcohol and cocaine abuse came with drug overdose and altered mental status. Patient was found to be in high anion gap metabolic acidosis. Also had leukocytosis fever and tachycardia started on empiric antibiotics vancomycin and Zosyn The patient was found in the room smoking. Also getting more anxious.  Objective: Filed Vitals:   08/11/15 1600  BP: 118/74  Pulse: 60  Temp: 98.5 F (36.9 C)  Resp: 19    Intake/Output Summary (Last 24 hours) at 08/11/15 1628 Last data filed at 08/11/15 1100  Gross per 24 hour  Intake   1275 ml  Output    325 ml  Net    950 ml   Filed Weights   08/09/15 0016 08/09/15  1508  Weight: 74.844 kg (165 lb) 74.5 kg (164 lb 3.9 oz)    Exam:   General:  Appears in no acute distress  Cardiovascular: S1S2 RRR  Respiratory: Clear bilaterally  Abdomen: Soft, nontender, no organomegaly  Musculoskeletal: No edema of the lower extremities   Data Reviewed: Basic Metabolic Panel:  Recent Labs Lab 08/08/15 2356 08/09/15 0011 08/09/15 0523 08/10/15 0916 08/11/15 0302  NA 144 143 141 141 139  K 4.6 4.5 3.4* 3.9 3.7  CL 108 109 113* 113* 110  CO2 13*  --  18* 21* 23  GLUCOSE 116* 112* 111* 85 117*  BUN CREATININE 2.37* 2.20* 2.20* 1.55* 1.33*  CALCIUM 10.4*  --  8.8* 8.6* 8.7*  MG 2.5*  --   --   --   --   PHOS 1.4*  --   --   --   --    Liver Function Tests:  Recent Labs Lab 08/08/15 2356 08/09/15 0523 08/10/15 0916 08/11/15 0302  AST 53* 41 97* 64*  ALT 22 20 71* 73*  ALKPHOS 71 46 43 41  BILITOT 0.7 1.5* 0.8 0.9  PROT 8.7* 6.1* 5.8* 6.1*  ALBUMIN 4.7 3.4* 3.0* 3.2*    Recent Labs Lab 08/08/15 2356  LIPASE 27   No results for input(s): AMMONIA in the last 168 hours. CBC:  Recent Labs Lab 08/08/15 2356 08/09/15 0011 08/09/15 0523 08/10/15 0916 08/11/15 0302  WBC 16.1*  --  16.0* 6.4 7.6  NEUTROABS 8.7*  --   --   --   --  HGB 16.1 17.3* 13.0 12.9* 12.8*  HCT 46.2 51.0 38.1* 36.8* 37.9*  MCV 81.6  --  83.4 82.0 82.0  PLT 233  --  132* 112* 125*   Cardiac Enzymes:  Recent Labs Lab 08/09/15 0213  CKTOTAL 429*   BNP (last 3 results) No results for input(s): BNP in the last 8760 hours.  ProBNP (last 3 results) No results for input(s): PROBNP in the last 8760 hours.  CBG:  Recent Labs Lab 08/10/15 0811 08/11/15 0810  GLUCAP 113* 111*    Recent Results (from the past 240 hour(s))  Urine culture     Status: None   Collection Time: 08/08/15 11:52 PM  Result Value Ref Range Status   Specimen Description URINE, RANDOM  Final   Special Requests NONE  Final   Culture NO GROWTH 1 DAY  Final    Report Status 08/10/2015 FINAL  Final  Culture, blood (routine x 2)     Status: None (Preliminary result)   Collection Time: 08/09/15  2:13 AM  Result Value Ref Range Status   Specimen Description BLOOD LEFT HAND  Final   Special Requests BOTTLES DRAWN AEROBIC ONLY 6CC  Final   Culture NO GROWTH 2 DAYS  Final   Report Status PENDING  Incomplete  Culture, blood (routine x 2)     Status: None (Preliminary result)   Collection Time: 08/09/15  2:17 AM  Result Value Ref Range Status   Specimen Description BLOOD LEFT FOREARM  Final   Special Requests BOTTLES DRAWN AEROBIC AND ANAEROBIC 5 CC  Final   Culture NO GROWTH 2 DAYS  Final   Report Status PENDING  Incomplete  MRSA PCR Screening     Status: None   Collection Time: 08/09/15  3:30 PM  Result Value Ref Range Status   MRSA by PCR NEGATIVE NEGATIVE Final    Comment:        The GeneXpert MRSA Assay (FDA approved for NASAL specimens only), is one component of a comprehensive MRSA colonization surveillance program. It is not intended to diagnose MRSA infection nor to guide or monitor treatment for MRSA infections.      Studies: No results found.  Scheduled Meds: . feeding supplement  1 Container Oral TID BM  . folic acid  1 mg Oral Daily  . heparin  5,000 Units Subcutaneous 3 times per day  . LORazepam  0-4 mg Intravenous Q12H  . multivitamin with minerals  1 tablet Oral Daily  . nicotine  21 mg Transdermal Daily  . pneumococcal 23 valent vaccine  0.5 mL Intramuscular Tomorrow-1000  . sodium chloride  3 mL Intravenous Q12H  . thiamine  100 mg Oral Daily   Continuous Infusions: . sodium chloride 75 mL/hr at 08/10/15 1610    Principal Problem:   Drug overdose Active Problems:   Asthma   AKI (acute kidney injury)   Sepsis   Overdose   Metabolic acidosis    Time spent: 25 min    [facility]  [facility] Pager [phone]. If 7PM-7AM, please contact night-coverage at [email or web address], password [facility] 08/11/2015,  4:28 PM  LOS: 2 days

## 2015-08-11 NOTE — Progress Notes (Signed)
RN walked by room and noticed the smell of smoke.  Walked into room, patient was in restroom with smoke coming around the door. Asked patient to come out of restroom. Spoke at length to the pt about the  dangers of smoking in the hospital. Security called when the pt would not give  smoking paraphilia for save keeping. Pt then provided a lighter that was tucked under  his gown. Pt stated he had no cigarettes.    Security arrived and spoke at length to the pt and explained the next steps should the  Pt try to smoke in the hospital again. Pt confessed to having a friend leave a cigarette Behind after visiting.   Will continue to monitor.

## 2015-08-11 NOTE — Clinical Social Work Note (Signed)
Clinical Social Work Assessment  Patient Details  Name: Dennis Sherman MRN: [id number] Date of Birth: 06/19/1996  Date of referral:  08/11/15               Reason for consult:  Substance Use/ETOH Abuse                Permission sought to share information with:  Family Supports Permission granted to share information::  Yes, Verbal Permission Granted  Name::      (dad)  Agency::   (arca)  Relationship::     Contact Information:     Housing/Transportation Living arrangements for the past 2 months:  Homeless, Single Family Home Source of Information:  Patient, Parent Patient Interpreter Needed:  None Criminal Activity/Legal Involvement Pertinent to Current Situation/Hospitalization:  No - Comment as needed Significant Relationships:  Parents Lives with:  Parents Do you feel safe going back to the place where you live?  No Need for family participation in patient care:  Yes (Comment)  Care giving concerns: Pt's dad is concerned that if pt does not get into an IP program then he will continue abusing drugs.  Dad is willing to let him come home with him at d/c, but is concerned that he will steal from him and sell his stuff for drugs.  Dad has recently had to kick pt out of his house due to his drug abuse/behaviors.  Social Worker assessment / plan: CSW spoke with both pt and his dad, who agree that an IP rehab program would be best for pt.  CSW explained that rehab beds are scarce and that one might not be secured prior to d/c.  CSW also discussed the availability of CDIOPs in the community, f/u with IP programs from home for availability, NA meetings, etc.  CSW did speak with [facility] in [place] who was agreeable to receive pt referral today, but informed CSW that it would not be considered until Monday.  Referral has been faxed, pt/dad and MD aware.  CSW will f/u with [facility] on Monday 8/15. Employment status:  Unemployed Health and safety inspector:  Self Pay (Medicaid Pending) PT Recommendations:  No  Follow Up Information / Referral to community resources:  Residential Substance Abuse Treatment Options  Patient/Family's Response to care: Agreeable to pursuing IP bed at [facility]. Patient/Family's Understanding of and Emotional Response to Diagnosis, Current Treatment, and Prognosis:  Pt "not himself" per Dad, asking if he had any Ativan.  Pt wanting to go outside and smoke.  Pt not paying attention to RN/smoking in bathroom/wanting to share a smoke with Dad.  Pt concerned about his Dad taking his shoes home.  Dad very concerned about pt, himself a former addict who wants to see his son get help.  Dad is angry re: pt's behavior in the hospital and afraid we will "throw him out." Emotional Assessment Appearance:  Appears stated age, Disheveled Attitude/Demeanor/Rapport:  Uncooperative Affect (typically observed):  Irritable Orientation:  Oriented to Situation, Oriented to  Time, Oriented to Place, Oriented to Self Alcohol / Substance use:  Tobacco Use, Illicit Drugs Psych involvement (Current and /or in the community):  No (Comment)  Discharge Needs  Concerns to be addressed:  Financial / Insurance Concerns, Substance Abuse Concerns, Discharge Planning Concerns Readmission within the last 30 days:  No Current discharge risk:  Substance Abuse, Inadequate Financial Supports Barriers to Discharge:  Active Substance Use, Other (limited rehab beds available)   Linna Caprice, LCSW 08/11/2015, 4:39 PM

## 2015-08-12 LAB — BASIC METABOLIC PANEL
ANION GAP: 9 (ref 5–15)
BUN: 8 mg/dL (ref 6–20)
CALCIUM: 9 mg/dL (ref 8.9–10.3)
CHLORIDE: 107 mmol/L (ref 101–111)
CO2: 22 mmol/L (ref 22–32)
Creatinine, Ser: 1 mg/dL (ref 0.61–1.24)
Glucose, Bld: 105 mg/dL — ABNORMAL HIGH (ref 65–99)
Potassium: 3.6 mmol/L (ref 3.5–5.1)
Sodium: 138 mmol/L (ref 135–145)

## 2015-08-12 MED ORDER — THIAMINE HCL 100 MG PO TABS: 100.0000 mg | ORAL_TABLET | Freq: Every day | ORAL | Status: AC

## 2015-08-12 MED ORDER — FOLIC ACID 1 MG PO TABS: 1.0000 mg | ORAL_TABLET | Freq: Every day | ORAL | Status: AC

## 2015-08-12 NOTE — Progress Notes (Signed)
Received called from the secretary that my patient in 3S09 ran off the unit. Lurena Joiner, RN chased after patient and got him to return to the unit. Security called and at bedside. Notified MD and MD to bedside. Patient states he wants to just go home. RN removed PIV because patient was about to rip it out. MD at bedside, waiting for MD orders.

## 2015-08-12 NOTE — Progress Notes (Signed)
Discussed discharge instructions and medications with patient and patients father. Patient verbalized understanding with all questions answered.  Pt discharged home with his dad. Security walking them out.   [facility]

## 2015-08-12 NOTE — Discharge Summary (Signed)
Physician Discharge Summary  Agostino Gorin [facility] ZOX:[id number] DOB: [birth year] DOA: 08/08/2015  PCP: No primary care provider on file.  Admit date: 08/08/2015 Discharge date: 08/12/2015  Time spent: 25* minutes  Recommendations for Outpatient Follow-up:  1. Follow up PCP in 2 weeks  Discharge Diagnoses:  Principal Problem:   Drug overdose Active Problems:   Asthma   AKI (acute kidney injury)   Sepsis   Overdose   Metabolic acidosis   Discharge Condition: Stable  Diet recommendation: Regular diet  Filed Weights   08/09/15 0016 08/09/15 1508  Weight: 74.844 kg (165 lb) 74.5 kg (164 lb 3.9 oz)    History of present illness:  19year-old male with a history of asthma, substance abuse alcohol and cocaine abuse came with drug overdose and altered mental status. Patient was found to be in high anion gap metabolic acidosis. Also had leukocytosis fever and tachycardia started on empiric antibiotics vancomycin and Zosyn. The patient feels better this morning. No signs of withdrawal.  Hospital Course:  1. Altered mental status/drug overdose- resolved, heroin /cocaine abuse. Patient denies suicidal ideation. He is agreeable to get rehab, patient is not in withdrawal, discussed with father who has come to the hospital to pick him up. 2. Fever/? Sepsis- when patient arrived to ED yesterday, he was febrile with leukocytosis. He was started on vancomycin and zosyn empirically. He is now afebrile, wbc is back to normal, 7.6. All the cultures (urine and blood) have been negative  till date. CXR clear, UA clear. Antibiotic discontinued. Patient remains afebrile 3. Metabolic acidosis- resolved, AG closed.  4. AKI- patient presented with AKI, with Cr of 2.20, was started on IV fluids and the creatinine is now down to 1.00. Discontinued the IV fluids. 5. Alcohol abuse-  Was started on  CIWA protocol. No withdrawal symptoms.  Will discharge home, patient's father going to call inpatient rehab in  [place] on  monday.  Procedures:  None  Consultations:  None  Discharge Exam: Filed Vitals:   08/12/15 1132  BP: 141/78  Pulse:   Temp: 97.9 F (36.6 C)  Resp:      Discharge Instructions   Discharge Instructions    Diet - low sodium heart healthy    Complete by:  As directed      Increase activity slowly    Complete by:  As directed           Current Discharge Medication List    START taking these medications   Details  folic acid (FOLVITE) 1 MG tablet Take 1 tablet (1 mg total) by mouth daily. Qty: 30 tablet, Refills: 1    thiamine 100 MG tablet Take 1 tablet (100 mg total) by mouth daily. Qty: 30 tablet, Refills: 1      CONTINUE these medications which have NOT CHANGED   Details  naloxone (NARCAN) 1 MG/ML injection Use as directed on packaging.  You must go to the hospital after using this medication. Qty: 2 mL, Refills: 0       No Known Allergies    The results of significant diagnostics from this hospitalization (including imaging, microbiology, ancillary and laboratory) are listed below for reference.    Significant Diagnostic Studies: US Renal  08/09/2015   CLINICAL DATA:  Acute onset of renal insufficiency. Initial encounter.  EXAM: RENAL / URINARY TRACT ULTRASOUND COMPLETE  COMPARISON:  None.  FINDINGS: Right Kidney:  Length: 10.9 cm. Echogenicity within normal limits. No mass or hydronephrosis visualized.  Left Kidney:  Length: 9.5 cm.  Echogenicity within normal limits. No mass or hydronephrosis visualized.  Bladder:  Not visualized on this study.  IMPRESSION: Unremarkable renal ultrasound. Bladder not characterized on this study.   Electronically Signed   By: Roanna Raider M.D.   On: 08/09/2015 04:03   Dg Chest Port 1 View  08/09/2015   CLINICAL DATA:  Acute onset of tachycardia. Overdose. Initial encounter.  EXAM: PORTABLE CHEST - 1 VIEW  COMPARISON:  Chest radiograph performed 09/07/2006  FINDINGS: The lungs are well-aerated and clear.  There is no evidence of focal opacification, pleural effusion or pneumothorax.  The cardiomediastinal silhouette is within normal limits. No acute osseous abnormalities are seen.  IMPRESSION: No acute cardiopulmonary process seen.   Electronically Signed   By: Roanna Raider M.D.   On: 08/09/2015 00:55    Microbiology: Recent Results (from the past 240 hour(s))  Urine culture     Status: None   Collection Time: 08/08/15 11:52 PM  Result Value Ref Range Status   Specimen Description URINE, RANDOM  Final   Special Requests NONE  Final   Culture NO GROWTH 1 DAY  Final   Report Status 08/10/2015 FINAL  Final  Culture, blood (routine x 2)     Status: None (Preliminary result)   Collection Time: 08/09/15  2:13 AM  Result Value Ref Range Status   Specimen Description BLOOD LEFT HAND  Final   Special Requests BOTTLES DRAWN AEROBIC ONLY 6CC  Final   Culture NO GROWTH 3 DAYS  Final   Report Status PENDING  Incomplete  Culture, blood (routine x 2)     Status: None (Preliminary result)   Collection Time: 08/09/15  2:17 AM  Result Value Ref Range Status   Specimen Description BLOOD LEFT FOREARM  Final   Special Requests BOTTLES DRAWN AEROBIC AND ANAEROBIC 5 CC  Final   Culture NO GROWTH 3 DAYS  Final   Report Status PENDING  Incomplete  MRSA PCR Screening     Status: None   Collection Time: 08/09/15  3:30 PM  Result Value Ref Range Status   MRSA by PCR NEGATIVE NEGATIVE Final    Comment:        The GeneXpert MRSA Assay (FDA approved for NASAL specimens only), is one component of a comprehensive MRSA colonization surveillance program. It is not intended to diagnose MRSA infection nor to guide or monitor treatment for MRSA infections.      Labs: Basic Metabolic Panel:  Recent Labs Lab 08/08/15 2356 08/09/15 0011 08/09/15 0523 08/10/15 0916 08/11/15 0302 08/12/15 0255  NA 144 143 141 141 139 138  K 4.6 4.5 3.4* 3.9 3.7 3.6  CL 108 109 113* 113* 110 107  CO2 13*  --  18* 21*  23 22  GLUCOSE 116* 112* 111* 85 117* 105*  BUN 13 18 19 16 10 8   CREATININE 2.37* 2.20* 2.20* 1.55* 1.33* 1.00  CALCIUM 10.4*  --  8.8* 8.6* 8.7* 9.0  MG 2.5*  --   --   --   --   --   PHOS 1.4*  --   --   --   --   --    Liver Function Tests:  Recent Labs Lab 08/08/15 2356 08/09/15 0523 08/10/15 0916 08/11/15 0302  AST 53* 41 97* 64*  ALT 22 20 71* 73*  ALKPHOS 71 46 43 41  BILITOT 0.7 1.5* 0.8 0.9  PROT 8.7* 6.1* 5.8* 6.1*  ALBUMIN 4.7 3.4* 3.0* 3.2*    Recent Labs Lab  08/08/15 2356  LIPASE 27   No results for input(s): AMMONIA in the last 168 hours. CBC:  Recent Labs Lab 08/08/15 2356 08/09/15 0011 08/09/15 0523 08/10/15 0916 08/11/15 0302  WBC 16.1*  --  16.0* 6.4 7.6  NEUTROABS 8.7*  --   --   --   --   HGB 16.1 17.3* 13.0 12.9* 12.8*  HCT 46.2 51.0 38.1* 36.8* 37.9*  MCV 81.6  --  83.4 82.0 82.0  PLT 233  --  132* 112* 125*   Cardiac Enzymes:  Recent Labs Lab 08/09/15 0213  CKTOTAL 429*   BNP: BNP (last 3 results) No results for input(s): BNP in the last 8760 hours.  ProBNP (last 3 results) No results for input(s): PROBNP in the last 8760 hours.  CBG:  Recent Labs Lab 08/10/15 0811 08/11/15 0810  GLUCAP 113* 111*       Signed:  Evan Osburn S  [facility] 08/12/2015, 4:26 PM

## 2015-08-12 NOTE — Progress Notes (Signed)
[facility] PROGRESS NOTE  Dennis Sherman [facility] ZOX:[id number] DOB: February 12, 1996 DOA: 08/08/2015 PCP: No primary care provider on file.  Assessment/Plan: 1. Altered mental status/drug overdose- resolved, heroin /cocaine abuse. Patient denies suicidal ideation. He is agreeable to get rehab, discussed with social work to see if patient can go to inpatient rehabilitation. 2. Fever/? Sepsis- when patient arrived to ED yesterday, he was febrile with leukocytosis. He was started on vancomycin and zosyn  empirically. He is now afebrile, wbc is back to normal. All the cultures have been negative so fr. CXR clear, UA clear. Antibiotic discontinued. Patient remains afebrile 3. Metabolic acidosis- resolved, AG closed.  4. AKI- patient presented with AKI, with Cr of 2.20, was started on IV fluids and the creatinine is now down to 1.00. Discontinue the IV fluids. 5. Asthma - stable, continue Xopenex prn 6. Alcohol abuse- getting more anxious, ? withdrawal, continue CIWA protocol 7. DVT prophylaxis- heparin  Code Status: Full code Family Communication: No family at bedside Disposition Plan: Home when medically stable   Consultants:  None  Procedures:  None  Antibiotics:  Vancomycin  Zosyn  HPI/Subjective: year-old male with a history of asthma, substance abuse alcohol and cocaine abuse came with drug overdose and altered mental status. Patient was found to be in high anion gap metabolic acidosis. Also had leukocytosis fever and tachycardia started on empiric antibiotics vancomycin and Zosyn. The patient feels better this morning. No signs of withdrawal.  Objective: Filed Vitals:   08/12/15 0720  BP: 131/89  Pulse: 65  Temp: 97.6 F (36.4 C)  Resp: 20    Intake/Output Summary (Last 24 hours) at 08/12/15 0945 Last data filed at 08/12/15 0900  Gross per 24 hour  Intake   1950 ml  Output   1200 ml  Net    750 ml   Filed Weights   08/09/15 0016 08/09/15 1508  Weight: 74.844 kg (165  lb) 74.5 kg (164 lb 3.9 oz)    Exam:   General:  Appears in no acute distress  Cardiovascular: S1S2 RRR  Respiratory: Clear bilaterally  Abdomen: Soft, nontender, no organomegaly  Musculoskeletal: No edema of the lower extremities   Data Reviewed: Basic Metabolic Panel:  Recent Labs Lab 08/08/15 2356 08/09/15 0011 08/09/15 0523 08/10/15 0916 08/11/15 0302 08/12/15 0255  NA 144 143 141 141 139 138  K 4.6 4.5 3.4* 3.9 3.7 3.6  CL 108 109 113* 113* 110 107  CO2 13*  --  18* 21* 23 22  GLUCOSE 116* 112* 111* 85 117* 105*  BUN 13 18 19 16 10 8   CREATININE 2.37* 2.20* 2.20* 1.55* 1.33* 1.00  CALCIUM 10.4*  --  8.8* 8.6* 8.7* 9.0  MG 2.5*  --   --   --   --   --   PHOS 1.4*  --   --   --   --   --    Liver Function Tests:  Recent Labs Lab 08/08/15 2356 08/09/15 0523 08/10/15 0916 08/11/15 0302  AST 53* 41 97* 64*  ALT 22 20 71* 73*  ALKPHOS 71 46 43 41  BILITOT 0.7 1.5* 0.8 0.9  PROT 8.7* 6.1* 5.8* 6.1*  ALBUMIN 4.7 3.4* 3.0* 3.2*    Recent Labs Lab 08/08/15 2356  LIPASE 27   No results for input(s): AMMONIA in the last 168 hours. CBC:  Recent Labs Lab 08/08/15 2356 08/09/15 0011 08/09/15 0523 08/10/15 0916 08/11/15 0302  WBC 16.1*  --  16.0* 6.4 7.6  NEUTROABS 8.7*  --   --   --   --  HGB 16.1 17.3* 13.0 12.9* 12.8*  HCT 46.2 51.0 38.1* 36.8* 37.9*  MCV 81.6  --  83.4 82.0 82.0  PLT 233  --  132* 112* 125*   Cardiac Enzymes:  Recent Labs Lab 08/09/15 0213  CKTOTAL 429*   BNP (last 3 results) No results for input(s): BNP in the last 8760 hours.  ProBNP (last 3 results) No results for input(s): PROBNP in the last 8760 hours.  CBG:  Recent Labs Lab 08/10/15 0811 08/11/15 0810  GLUCAP 113* 111*    Recent Results (from the past 240 hour(s))  Urine culture     Status: None   Collection Time: 08/08/15 11:52 PM  Result Value Ref Range Status   Specimen Description URINE, RANDOM  Final   Special Requests NONE  Final   Culture  NO GROWTH 1 DAY  Final   Report Status 08/10/2015 FINAL  Final  Culture, blood (routine x 2)     Status: None (Preliminary result)   Collection Time: 08/09/15  2:13 AM  Result Value Ref Range Status   Specimen Description BLOOD LEFT HAND  Final   Special Requests BOTTLES DRAWN AEROBIC ONLY 6CC  Final   Culture NO GROWTH 2 DAYS  Final   Report Status PENDING  Incomplete  Culture, blood (routine x 2)     Status: None (Preliminary result)   Collection Time: 08/09/15  2:17 AM  Result Value Ref Range Status   Specimen Description BLOOD LEFT FOREARM  Final   Special Requests BOTTLES DRAWN AEROBIC AND ANAEROBIC 5 CC  Final   Culture NO GROWTH 2 DAYS  Final   Report Status PENDING  Incomplete  MRSA PCR Screening     Status: None   Collection Time: 08/09/15  3:30 PM  Result Value Ref Range Status   MRSA by PCR NEGATIVE NEGATIVE Final    Comment:        The GeneXpert MRSA Assay (FDA approved for NASAL specimens only), is one component of a comprehensive MRSA colonization surveillance program. It is not intended to diagnose MRSA infection nor to guide or monitor treatment for MRSA infections.      Studies: No results found.  Scheduled Meds: . feeding supplement  1 Container Oral TID BM  . folic acid  1 mg Oral Daily  . heparin  5,000 Units Subcutaneous 3 times per day  . LORazepam  0-4 mg Intravenous Q12H  . multivitamin with minerals  1 tablet Oral Daily  . nicotine  21 mg Transdermal Daily  . pneumococcal 23 valent vaccine  0.5 mL Intramuscular Tomorrow-1000  . sodium chloride  3 mL Intravenous Q12H  . thiamine  100 mg Oral Daily   Continuous Infusions: . sodium chloride 75 mL/hr at 08/10/15 1610    Principal Problem:   Drug overdose Active Problems:   Asthma   AKI (acute kidney injury)   Sepsis   Overdose   Metabolic acidosis    Time spent: 25 min    [facility]  [facility] Pager [phone]. If 7PM-7AM, please contact night-coverage at [email or web address],  password [facility] 08/12/2015, 9:45 AM  LOS: 3 days

## 2015-08-14 LAB — CULTURE, BLOOD (ROUTINE X 2)
CULTURE: NO GROWTH
CULTURE: NO GROWTH

## 2015-09-03 IMAGING — US US RENAL
1 series · 14 of 23 positions shown · non-contrast
Comparison: None.

CLINICAL DATA: Acute onset of renal insufficiency. Initial
encounter.

EXAM:
RENAL / URINARY TRACT ULTRASOUND COMPLETE

[Series 1: us renal · 0.22mm/px · 14 of 23 slices shown]
[im 1/23]
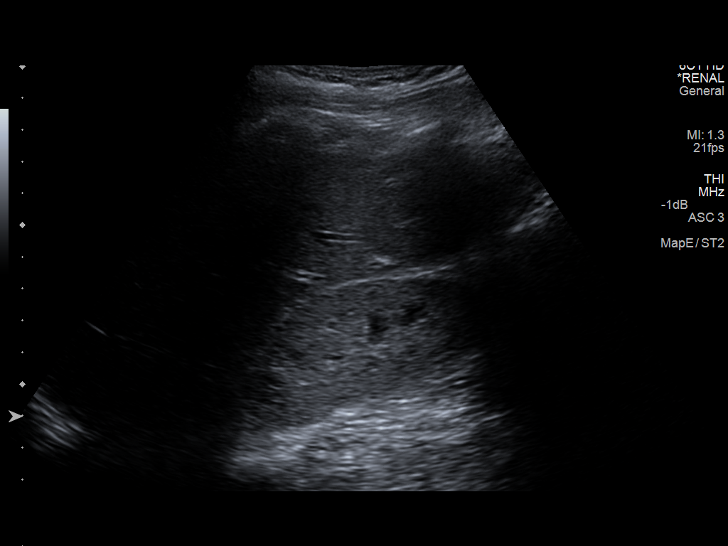
[im 3/23]
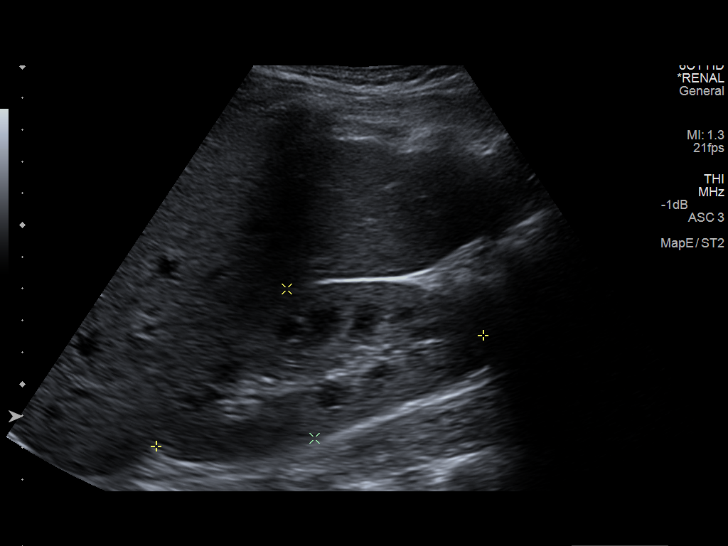
[im 5/23]
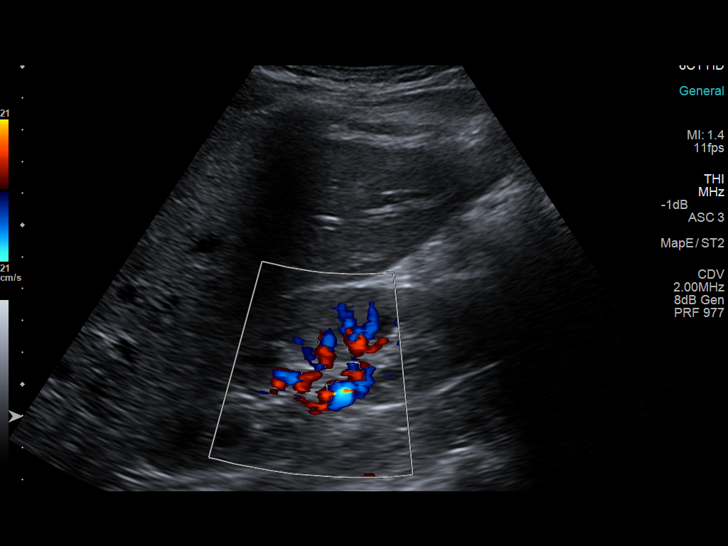
[im 6/23]
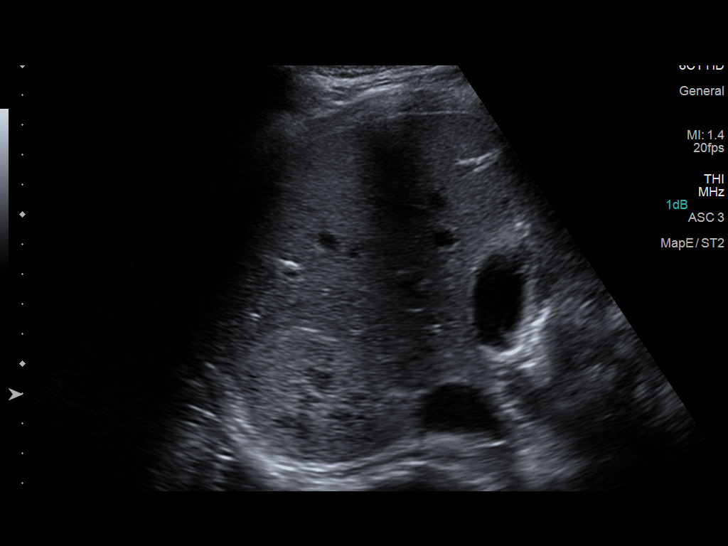
[im 8/23]
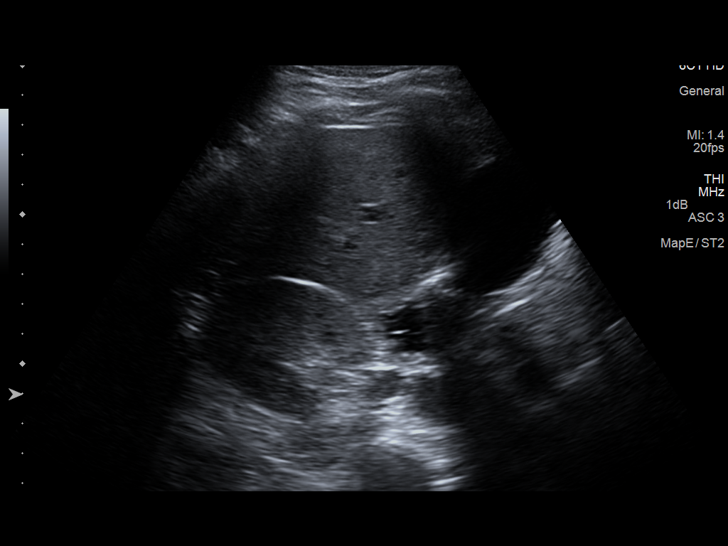
[im 10/23]
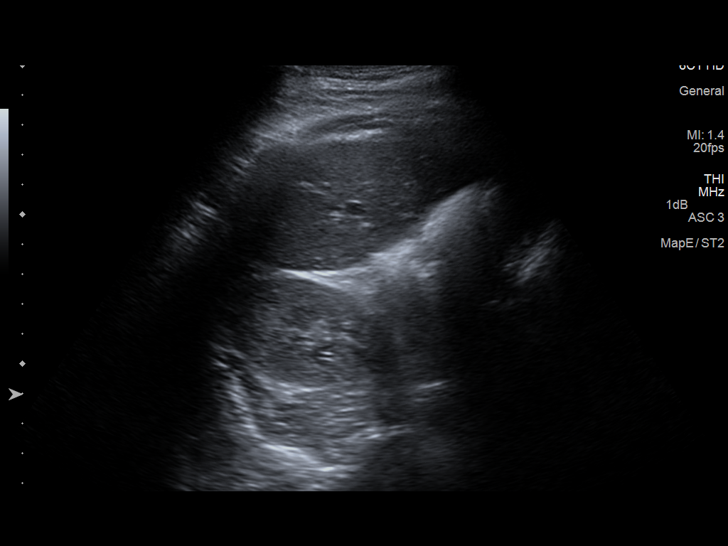
[im 11/23]
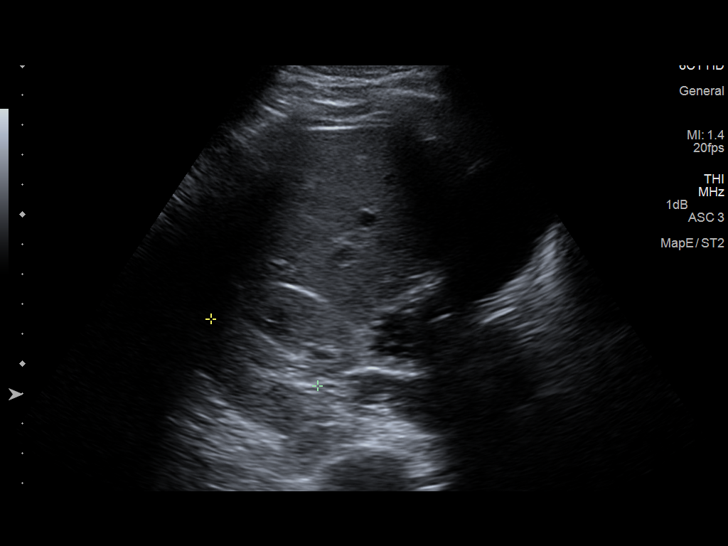
[im 13/23]
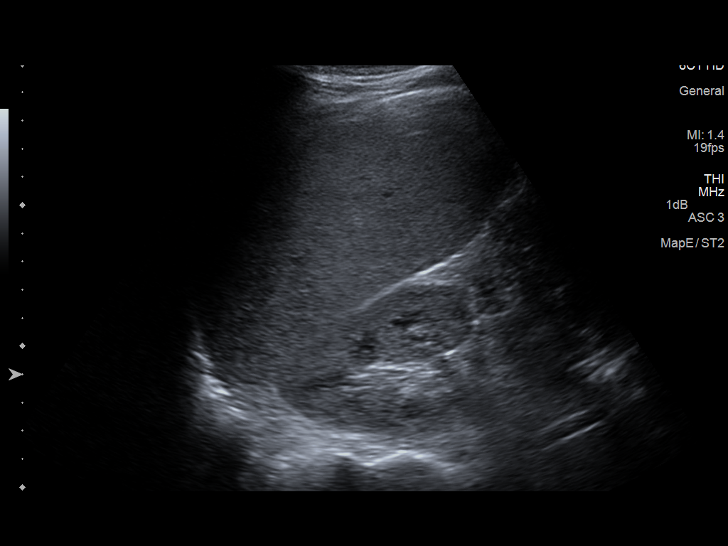
[im 14/23]
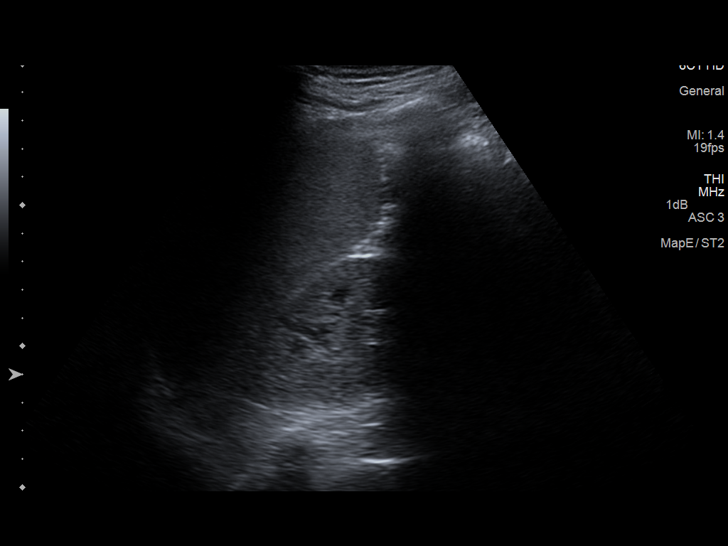
[im 16/23]
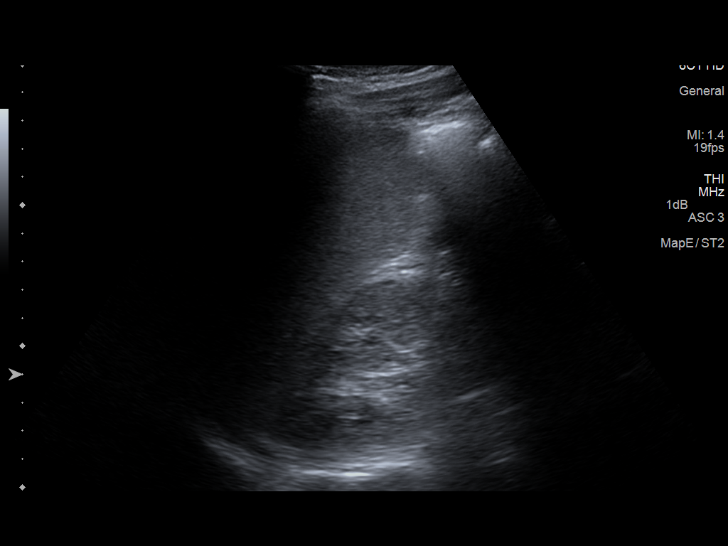
[im 18/23]
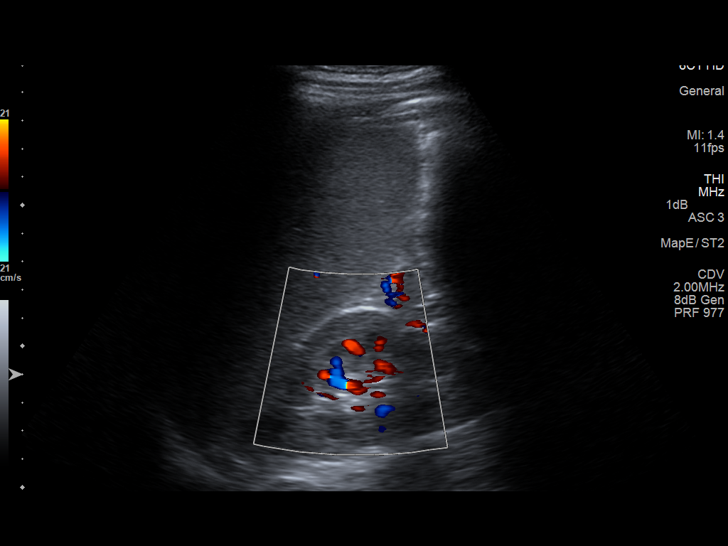
[im 19/23]
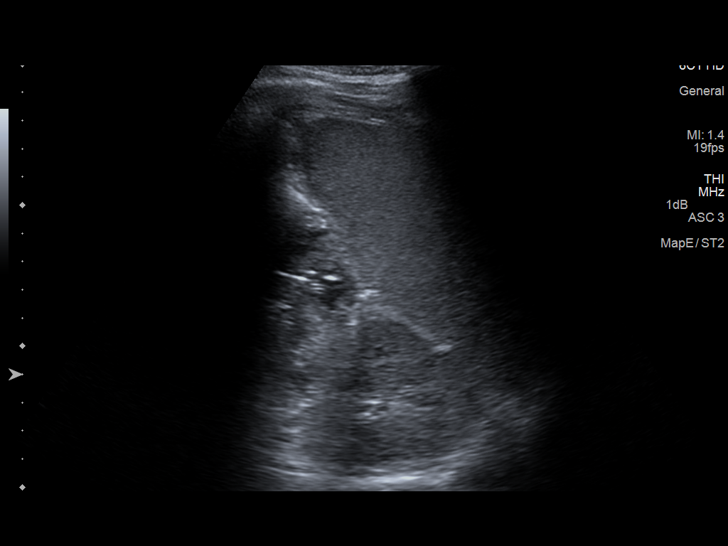
[im 21/23]
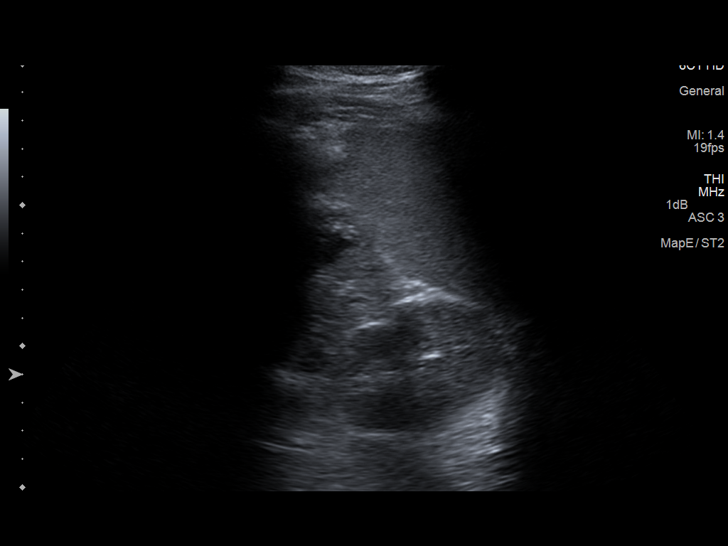
[im 23/23]
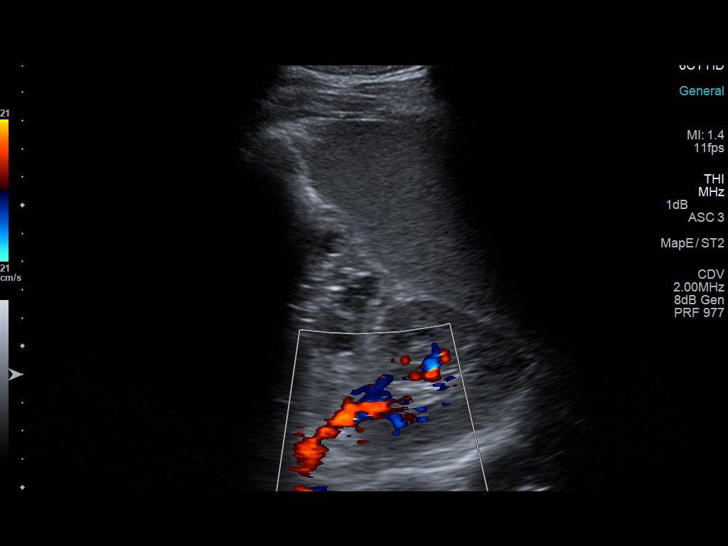

[14 of 23 positions shown; findings below may reference images not displayed]

FINDINGS: Right Kidney:

Length: 10.9 cm. Echogenicity within normal limits. No mass or
hydronephrosis visualized.

Left Kidney:

Length: 9.5 cm. Echogenicity within normal limits. No mass or
hydronephrosis visualized.

Bladder:

Not visualized on this study.
IMPRESSION: Unremarkable renal ultrasound. Bladder not characterized on this
study.

## 2017-03-19 ENCOUNTER — Other Ambulatory Visit: Payer: Self-pay | Admitting: Family Medicine

## 2017-03-19 DIAGNOSIS — B182 Chronic viral hepatitis C: Secondary | ICD-10-CM

## 2017-03-25 ENCOUNTER — Ambulatory Visit
Admission: RE | Admit: 2017-03-25 | Discharge: 2017-03-25 | Disposition: A | Discharge status: Home / Self Care | Payer: BLUE CROSS/BLUE SHIELD | Source: Ambulatory Visit | Attending: Family Medicine | Admitting: Family Medicine

## 2017-03-25 DIAGNOSIS — B182 Chronic viral hepatitis C: Secondary | ICD-10-CM

## 2021-11-22 ENCOUNTER — Other Ambulatory Visit: Payer: Self-pay

## 2021-11-22 ENCOUNTER — Ambulatory Visit
Admission: EM | Admit: 2021-11-22 | Discharge: 2021-11-22 | Disposition: A | Discharge status: Home / Self Care | Payer: BLUE CROSS/BLUE SHIELD | Attending: Physician Assistant | Admitting: Physician Assistant

## 2021-11-22 ENCOUNTER — Encounter: Payer: Self-pay | Admitting: Emergency Medicine

## 2021-11-22 DIAGNOSIS — J069 Acute upper respiratory infection, unspecified: Secondary | ICD-10-CM

## 2021-11-22 DIAGNOSIS — J029 Acute pharyngitis, unspecified: Secondary | ICD-10-CM

## 2021-11-22 LAB — POCT INFLUENZA A/B
Influenza A, POC: NEGATIVE
Influenza B, POC: NEGATIVE

## 2021-11-22 LAB — POCT RAPID STREP A (OFFICE): Rapid Strep A Screen: NEGATIVE

## 2021-11-22 NOTE — Discharge Instructions (Addendum)
Recommend symptomatic treatment. Encourage increased fluids and rest. Follow up if symptoms worsen.

## 2021-11-22 NOTE — ED Triage Notes (Signed)
Patient c/o fever on Wednesday, body aches, white patches on throat noticed today.  Home COVID tests were negative.  Patient has taken  Flonase and 2 red spots appeared on face.

## 2021-11-22 NOTE — ED Notes (Signed)
Patient declined covid test after printing out requisition. Requested provider come back in to speak with him.

## 2021-11-23 ENCOUNTER — Other Ambulatory Visit: Payer: Self-pay

## 2021-11-23 NOTE — ED Provider Notes (Signed)
[facility]    CSN: [id number] Arrival date & time: 11/22/21  1935      History   Chief Complaint Chief Complaint  Patient presents with   Fever    HPI Dennis Sherman is a 25 y.o. male.   Patient here today for 3-day history of fever, body aches, sore throat, mild cough and congestion.  He reports his grandmother told him he had white patches on the back of his throat which concerned him.  He has tried over-the-counter medication without significant relief.  He denies any vomiting or diarrhea.  He also reports after using Flonase he thinks that he had to small pink patches appeared to his face.  His grandmother told him that these were present when he arrived and not related to [facility] but he is not convinced this was the case.  The history is provided by the patient.  Fever Associated symptoms: chills, congestion, cough and sore throat   Associated symptoms: no ear pain, no nausea and no vomiting    Past Medical History:  Diagnosis Date   Anxiety    Asthma    Depression    Polysubstance abuse (HCC)    "hospitalized a few times for overdosing" (08/09/2015)    Patient Active Problem List   Diagnosis Date Noted   Drug overdose 08/09/2015   AKI (acute kidney injury) (HCC) 08/09/2015   Sepsis (HCC) 08/09/2015   Overdose 08/09/2015   Metabolic acidosis 08/09/2015   Asthma     Past Surgical History:  Procedure Laterality Date   ADENOIDECTOMY  ~ 2009       Home Medications    Prior to Admission medications   Medication Sig Start Date End Date Taking? Authorizing Provider  folic acid (FOLVITE) 1 MG tablet Take 1 tablet (1 mg total) by mouth daily. 08/12/15   Meredeth Ide, MD  naloxone [facility]) 1 MG/ML injection Use as directed on packaging.  You must go to the hospital after using this medication. Patient taking differently: Inject 1 mg into the muscle once. Use as directed on packaging.  You must go to the hospital after using this medication. 01/01/15    Gerhard Munch, MD  thiamine 100 MG tablet Take 1 tablet (100 mg total) by mouth daily. 08/12/15   Meredeth Ide, MD    Family History No family history on file.  Social History Social History   Tobacco Use   Smoking status: Every Day    Packs/day: 1.00    Years: 10.00    Pack years: 10.00    Types: Cigarettes   Smokeless tobacco: Never  Substance Use Topics   Alcohol use: Yes    Comment: 08/09/2015 "if I'm not on any drugs I drink ~ 12 beers; that's not often right now; been on drugs for awhile"   Drug use: Yes    Types: Cocaine, IV, Heroin, "Crack" cocaine     Allergies   Patient has no known allergies.   Review of Systems Review of Systems  Constitutional:  Positive for chills and fever.  HENT:  Positive for congestion and sore throat. Negative for ear pain.   Eyes:  Negative for discharge and redness.  Respiratory:  Positive for cough. Negative for shortness of breath.   Gastrointestinal:  Negative for abdominal pain, nausea and vomiting.    Physical Exam Triage Vital Signs ED Triage Vitals  Enc Vitals Group     BP 11/22/21 1953 140/88     Pulse Rate 11/22/21 1953 (!)  109     Resp --      Temp 11/22/21 1953 98.1 F (36.7 C)     Temp Source 11/22/21 1953 Oral     SpO2 11/22/21 1953 98 %     Weight 11/22/21 1955 207 lb (93.9 kg)     Height 11/22/21 1955 5\' 8"  (1.727 m)     Head Circumference --      Peak Flow --      Pain Score 11/22/21 1955 7     Pain Loc --      Pain Edu? --      Excl. in GC? --    No data found.  Updated Vital Signs BP 140/88 (BP Location: Left Arm)   Pulse (!) 109   Temp 98.1 F (36.7 C) (Oral)   Ht 5\' 8"  (1.727 m)   Wt 207 lb (93.9 kg)   SpO2 98%   BMI 31.47 kg/m   Physical Exam Vitals and nursing note reviewed.  Constitutional:      General: He is not in acute distress.    Appearance: Normal appearance. He is not ill-appearing.  HENT:     Head: Normocephalic and atraumatic.     Nose: Nose normal. No congestion.      Mouth/Throat:     Mouth: Mucous membranes are moist.     Pharynx: Oropharyngeal exudate and posterior oropharyngeal erythema present.  Eyes:     Conjunctiva/sclera: Conjunctivae normal.  Cardiovascular:     Rate and Rhythm: Normal rate and regular rhythm.     Heart sounds: Normal heart sounds. No murmur heard. Pulmonary:     Effort: Pulmonary effort is normal. No respiratory distress.     Breath sounds: Normal breath sounds. No wheezing, rhonchi or rales.  Skin:    General: Skin is warm and dry.     Comments: Two ~one cm mildly erythematous macular patches to right temple, right maxillary area  Neurological:     Mental Status: He is alert.  Psychiatric:        Mood and Affect: Mood normal.        Thought Content: Thought content normal.     UC Treatments / Results  Labs (all labs ordered are listed, but only abnormal results are displayed) Labs Reviewed  CULTURE, GROUP A STREP ([facility])  COVID-19, FLU A+B AND RSV  POCT RAPID STREP A (OFFICE)  POCT INFLUENZA A/B    EKG   Radiology No results found.  Procedures Procedures (including critical care time)  Medications Ordered in UC Medications - No data to display  Initial Impression / Assessment and Plan / UC Course  I have reviewed the triage vital signs and the nursing notes.  Pertinent labs & imaging results that were available during my care of the patient were reviewed by me and considered in my medical decision making (see chart for details).    Strep test negative.  Will order throat culture.  Flu test also negative.  Patient declines COVID screening.  Will await culture results for further recommendation but in the meantime encouraged symptomatic treatment.  Discussed that I do not feel skin changes were related to Flonase but recommended he discontinue Flonase at this time.  Patient expresses understanding.  Final Clinical Impressions(s) / UC Diagnoses   Final diagnoses:  Sore throat  Acute upper  respiratory infection     Discharge Instructions      Recommend symptomatic treatment. Encourage increased fluids and rest. Follow up if symptoms worsen.  ED Prescriptions   None    PDMP not reviewed this encounter.   Tomi Bamberger, PA-C 11/23/21 [phone]

## 2021-11-27 LAB — CULTURE, GROUP A STREP

## 2021-11-27 LAB — SPECIMEN STATUS REPORT

## 2022-12-23 NOTE — Progress Notes (Deleted)
[place]                       Date of Exam: 12/24/2022 6:42 PM        Patient ID: Marvin Harris is a 26 y.o. male.  Provider: Jay Schlichter, DNP FNP      This visit was conducted with the use of interactive video and audio telecommunication that permitted real time communication between [place] and Jay Schlichter, DNP FNP. He consented to participation and received services at home to minimize exposure to COVID-19 while I was located at my office at [facility].         Chief Complaint:    No chief complaint on file.              HPI:    HPI  26 y.o. male presents via telemedicine to establish care today.  Reports was receiving PCP services from [facility] with last visit on              Problem List:    There is no problem list on file for this patient.            Current Meds:    No outpatient medications have been marked as taking for the 12/24/22 encounter (Appointment) with Jay Schlichter, DNP FNP.          Allergies:    Not on File          Past Surgical History:    No past surgical history on file.        Family History:    No family history on file.        Social History:             The following sections were reviewed this encounter by the provider:            Vital Signs:    There were no vitals taken for this visit.         ROS:    Review of Systems   Constitutional:  Negative for chills, diaphoresis and fever.   Eyes:  Negative for pain and visual disturbance.   Respiratory:  Negative for cough and shortness of breath.    Cardiovascular:  Negative for chest pain.   Gastrointestinal:  Negative for diarrhea, nausea and vomiting.   Genitourinary:  Negative for hematuria.   Musculoskeletal:  Negative for arthralgias, gait problem and myalgias.   Skin:  Negative for rash.   Neurological:  Negative for dizziness, weakness, numbness and headaches.   Psychiatric/Behavioral:  Negative for self-injury and suicidal ideas.         Denies depression and anxiety                   Physical Exam:    Physical Exam  Constitutional:       General: He is not in acute distress.     Appearance: Normal appearance. He is not ill-appearing, toxic-appearing or diaphoretic.   Pulmonary:      Effort: Pulmonary effort is normal. No respiratory distress.      Comments: Patient is able to speak in full sentences without issues.  Neurological:      Mental Status: He is alert.   Psychiatric:         Attention and Perception: Attention normal.         Mood and Affect: Mood normal.         Speech: Speech  normal.         Behavior: Behavior is cooperative.                Assessment/Plan:    1. Encounter to establish care        Reviewed treatment plan inclusive of risks and benefits. Patient agreeable with plan, all questions answered. ED precautions reviewed; patient verbalized understanding.  Time spent in discussion:             Follow-up:    No follow-ups on file.             Jay Schlichter, DNP FNP

## 2022-12-24 ENCOUNTER — Telehealth (INDEPENDENT_AMBULATORY_CARE_PROVIDER_SITE_OTHER): Payer: PRIVATE HEALTH INSURANCE

## 2022-12-24 ENCOUNTER — Telehealth (INDEPENDENT_AMBULATORY_CARE_PROVIDER_SITE_OTHER): Payer: Self-pay

## 2022-12-24 DIAGNOSIS — Z7689 Persons encountering health services in other specified circumstances: Secondary | ICD-10-CM

## 2022-12-24 NOTE — Telephone Encounter (Signed)
LVM for pt to return call to answer video call prep ??'s

## 2023-08-07 ENCOUNTER — Emergency Department
Admission: EM | Admit: 2023-08-07 | Discharge: 2023-08-08 | Disposition: A | Discharge status: Home / Self Care | Payer: No Typology Code available for payment source | Attending: Emergency Medical Services | Admitting: Emergency Medical Services

## 2023-08-07 ENCOUNTER — Emergency Department: Payer: No Typology Code available for payment source

## 2023-08-07 DIAGNOSIS — F17290 Nicotine dependence, other tobacco product, uncomplicated: Secondary | ICD-10-CM | POA: Insufficient documentation

## 2023-08-07 DIAGNOSIS — R079 Chest pain, unspecified: Secondary | ICD-10-CM

## 2023-08-07 DIAGNOSIS — R0789 Other chest pain: Secondary | ICD-10-CM | POA: Insufficient documentation

## 2023-08-08 ENCOUNTER — Emergency Department: Payer: No Typology Code available for payment source

## 2023-08-08 LAB — ECG 12-LEAD
Atrial Rate: 87 {beats}/min
IHS MUSE NARRATIVE AND IMPRESSION: NORMAL
P Axis: 70 degrees
P-R Interval: 154 ms
Q-T Interval: 352 ms
QTC Calculation (Bezet): 423 ms
T Axis: 61 degrees
Ventricular Rate: 87 {beats}/min

## 2023-08-08 LAB — HIGH SENSITIVITY TROPONIN-I: hs Troponin: 5.3 ng/L (ref <=35.0)

## 2023-08-08 NOTE — ED Provider Notes (Signed)
History     Chief Complaint   Patient presents with    Chest Pain     Patient notes intermittent spasming of his left chest muscle twitching going on for a month sometimes he feels something deep inside.  He has not been working out.  However today he decided to work out really hard including chest workout.  Tonight at rest approximately 3 hours ago he developed a discomfort in his chest and ache in his chest that is been persistent he did also strain his shoulder working out anteriorly.  No shortness of breath is not pleuritic no respiratory symptoms no nausea no sweating.  He does use tobacco through a vape does not use drugs denies other health history        Medical History[1]  Denies  History reviewed. No pertinent surgical history.    History reviewed. No pertinent family history.    Social  Social History[2]    . Occ vape      Allergies[3]    Home Medications       Med List Status: In Progress Set By: Zannie Cove, RN at 08/07/2023 11:11 PM   No Medications          Review of Systems   Constitutional: Negative.    Respiratory: Negative.     Cardiovascular:  Positive for chest pain.       Physical Exam    BP: 137/87, Heart Rate: 91, Temp: 98.6 F (37 C), Resp Rate: 19, SpO2: 98 %, Weight: 100 kg     Physical Exam  Cardiovascular:      Rate and Rhythm: Normal rate and regular rhythm.      Pulses: Normal pulses.      Heart sounds: Normal heart sounds.   Pulmonary:      Effort: Pulmonary effort is normal.      Breath sounds: Normal breath sounds.   Chest:      Chest wall: No tenderness.   Abdominal:      General: Bowel sounds are normal.      Palpations: Abdomen is soft.   Musculoskeletal:      Right lower leg: No edema.      Left lower leg: No edema.   Skin:     General: Skin is warm.      Capillary Refill: Capillary refill takes less than 2 seconds.   Neurological:      General: No focal deficit present.      Mental Status: He is alert.           MDM and ED Course     ED Medication Orders (From  admission, onward)      None               Medical Decision Making  Amount and/or Complexity of Data Reviewed  Labs: ordered.  Radiology: ordered.  ECG/medicine tests: ordered.             Differential diagnosis chest wall strain palpitations ischemia rib fracture chest mass    I independently interpreted EKG normal sinus rhythm 87 QRS 96 no ischemic ST elevation there is a T wave inversion aVL    Patient has hours of chest discomfort beginning at rest apart from occasional vaping no real cardiac risk factors this was nonexertional after hours of symptoms constant troponin was negative    I independently interpreted x-ray negative for pneumothorax rib fracture or chest mass    Patient wanted to minimize the amount of testing  he was worried about cost I think that is reasonable I do not know in a nonexertional chest pain patient 3 hours constant symptoms that a second troponin really will add much value advised more likely he would have an elevated troponin after that amount of time and I think cardiac ischemia is unlikely he is comfortable with this assessment   He did strain his shoulder we did not really focus on that today this was from lifting he is had ongoing shoulder issues for a long time I gave him the name for an orthopedics provider    I gave him name for a local primary care physician he states he does not have one          Procedures    Clinical Impression & Disposition     Clinical Impression  Final diagnoses:   Chest pain at rest        ED Disposition       ED Disposition   Discharge    Condition   --    Date/Time   Sat Aug 08, 2023 12:26 AM    Comment   Doretha Imus discharge to home/self care.    Condition at disposition: Stable                  New Prescriptions    No medications on file                     [1] History reviewed. No pertinent past medical history.  [2]   Social History  Tobacco Use    Smoking status: Never    Smokeless tobacco: Never   Vaping Use    Vaping status: Every Day    Substance Use Topics    Alcohol use: Not Currently    Drug use: Not Currently   [3] No Known Allergies       Chase Picket, MD  08/08/23 [phone]

## 2023-08-10 LAB — ECG 12-LEAD
QRS Duration: 96 ms
R Axis: 71 degrees
